# Patient Record
Sex: Female | Born: 1995 | Race: Black or African American | Hispanic: No | Marital: Single | Smoking: Light tobacco smoker
Health system: Southern US, Community
[De-identification: ages and names within clinical notes are randomized; demographics above are authoritative.]

## PROBLEM LIST (undated history)

## (undated) ENCOUNTER — Inpatient Hospital Stay (HOSPITAL_COMMUNITY): Payer: Self-pay

## (undated) DIAGNOSIS — O42912 Preterm premature rupture of membranes, unspecified as to length of time between rupture and onset of labor, second trimester: Secondary | ICD-10-CM

## (undated) DIAGNOSIS — A549 Gonococcal infection, unspecified: Secondary | ICD-10-CM

---
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
## 2016-02-28 ENCOUNTER — Emergency Department (HOSPITAL_COMMUNITY)
Admission: EM | Admit: 2016-02-28 | Discharge: 2016-02-28 | Disposition: A | Discharge status: Home / Self Care | Payer: Medicaid Other | Attending: Emergency Medicine | Admitting: Emergency Medicine

## 2016-02-28 ENCOUNTER — Emergency Department (HOSPITAL_COMMUNITY): Payer: Medicaid Other

## 2016-02-28 ENCOUNTER — Encounter (HOSPITAL_COMMUNITY): Payer: Self-pay | Admitting: Emergency Medicine

## 2016-02-28 DIAGNOSIS — F17210 Nicotine dependence, cigarettes, uncomplicated: Secondary | ICD-10-CM | POA: Diagnosis not present

## 2016-02-28 DIAGNOSIS — J45901 Unspecified asthma with (acute) exacerbation: Secondary | ICD-10-CM | POA: Diagnosis not present

## 2016-02-28 DIAGNOSIS — J45902 Unspecified asthma with status asthmaticus: Secondary | ICD-10-CM | POA: Insufficient documentation

## 2016-02-28 DIAGNOSIS — W19XXXA Unspecified fall, initial encounter: Secondary | ICD-10-CM

## 2016-02-28 DIAGNOSIS — S0181XA Laceration without foreign body of other part of head, initial encounter: Secondary | ICD-10-CM | POA: Diagnosis not present

## 2016-02-28 DIAGNOSIS — Y929 Unspecified place or not applicable: Secondary | ICD-10-CM | POA: Insufficient documentation

## 2016-02-28 DIAGNOSIS — Y939 Activity, unspecified: Secondary | ICD-10-CM | POA: Diagnosis not present

## 2016-02-28 DIAGNOSIS — Y999 Unspecified external cause status: Secondary | ICD-10-CM | POA: Insufficient documentation

## 2016-02-28 DIAGNOSIS — W01119A Fall on same level from slipping, tripping and stumbling with subsequent striking against unspecified sharp object, initial encounter: Secondary | ICD-10-CM | POA: Diagnosis not present

## 2016-02-28 DIAGNOSIS — S0990XA Unspecified injury of head, initial encounter: Secondary | ICD-10-CM | POA: Diagnosis present

## 2016-02-28 DIAGNOSIS — Z23 Encounter for immunization: Secondary | ICD-10-CM | POA: Diagnosis not present

## 2016-02-28 MED ORDER — HYDROCODONE-ACETAMINOPHEN 5-325 MG PO TABS: 1.0000 | ORAL_TABLET | Freq: Four times a day (QID) | ORAL | 0 refills | Status: DC | PRN

## 2016-02-28 MED ORDER — LIDOCAINE HCL 2 % IJ SOLN
20.0000 mL | Freq: Once | INTRAMUSCULAR | Status: AC
  Administered 2016-02-28: 400 mg
  Filled 2016-02-28: qty 20

## 2016-02-28 MED ORDER — TETANUS-DIPHTH-ACELL PERTUSSIS 5-2.5-18.5 LF-MCG/0.5 IM SUSP
0.5000 mL | Freq: Once | INTRAMUSCULAR | Status: AC
  Administered 2016-02-28: 0.5 mL via INTRAMUSCULAR
  Filled 2016-02-28: qty 0.5

## 2016-02-28 MED ORDER — BACITRACIN-NEOMYCIN-POLYMYXIN 400-5-5000 EX OINT
TOPICAL_OINTMENT | Freq: Once | CUTANEOUS | Status: AC
  Administered 2016-02-28: 1 via TOPICAL
  Filled 2016-02-28: qty 1

## 2016-02-28 NOTE — ED Notes (Signed)
Bed: SL37 Expected date:  Expected time:  Means of arrival:  Comments: Fall, head injury, loc

## 2016-02-28 NOTE — Discharge Instructions (Signed)
You were seen today after fall. You had a laceration repaired that needs stitches removed in 5 days.  Keep moist with bacitracin ointment.  Your CT scan was otherwise unremarkable. Follow-up with her primary physician for suture removal.

## 2016-02-28 NOTE — ED Triage Notes (Signed)
Pt reports falling and hitting face on dresser. Pt states that when she saw blood she passed out for a few seconds. Pt states that she had tripped and hit the dresser.

## 2016-02-28 NOTE — ED Notes (Signed)
Patient transported to CT 

## 2016-02-28 NOTE — ED Provider Notes (Signed)
LACERATION REPAIR Performed by: Arman Filter Authorized by: Arman Filter Consent: Verbal consent obtained. Risks and benefits: risks, benefits and alternatives were discussed Consent given by: patient Patient identity confirmed: provided demographic data Prepped and Draped in normal sterile fashion Wound explored  Laceration Location: nose  Laceration Length: 2cm  No Foreign Bodies seen or palpated  Anesthesia: local infiltration  Local anesthetic: lidocaine 1% without epinephrine  Anesthetic total: 2 ml  Irrigation method: syringe Amount of cleaning: standard  Skin closure: 3  6- prolene Subcutaneous 2 6-0 rapid vicryl  Number of sutures: 5  Technique: int.  Patient tolerance: Patient tolerated the procedure well with no immediate complications.   Earley Favor, NP 02/28/16 0165    Shon Baton, MD 02/28/16 [phone]

## 2016-02-28 NOTE — ED Triage Notes (Signed)
2cm x 0.5cm laceration to bridge of nose with bleeding controlled by 4x4

## 2016-02-28 NOTE — ED Notes (Signed)
Provider at bedside to perform laceration repair.
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
## 2016-02-28 NOTE — ED Notes (Signed)
Pt ambulated to restroom without any difficulties. Awaiting provider to perform laceration repair.

## 2016-02-28 NOTE — ED Provider Notes (Signed)
[facility]-EMERGENCY DEPT Provider Note   CSN: [id number] Arrival date & time: 02/28/16  0003  First Provider Contact:   First MD Initiated Contact with Patient 02/28/16 0030      By signing my name below, I, Laurie Sanders, attest that this documentation has been prepared under the direction and in the presence of Shon Baton, MD.  Electronically Signed: Octavia Sanders, ED Scribe. 02/28/16. 12:36 AM.    History   Chief Complaint No chief complaint on file.   The history is provided by the patient. No language interpreter was used.   HPI Comments: Laurie Sanders is a 20 y.o. female who presents to the Emergency Department after a fall that occurred this evening. She describes her facial pain as 8/10 and sore. Pt reports she tripped, fell and hit her face on her dresser. She has a small laceration to the bridge of her nose with bleeding currently controlled. Pt says she did not lose consciousness initially after the impact but did lose consciousness after seeing her blood. She denies vomiting. She is not up to date on her tetanus shot and does not have any known drug allergies.   Past Medical History:  Diagnosis Date  . Asthma   . Preterm premature rupture of membranes in second trimester 04/29/2011    Patient Active Problem List   Diagnosis Date Noted  . Respiratory failure (HCC) 06/11/2012  . Asthma exacerbation 06/11/2012  . Asthma with status asthmaticus 06/11/2012  . Status post cesarean delivery 06/16/2011    Past Surgical History:  Procedure Laterality Date  . CESAREAN SECTION  05/09/2011   Procedure: CESAREAN SECTION;  Surgeon: Catalina Antigua, MD;  Location: [facility];  Service: Gynecology;  Laterality: N/A;  . NO PAST SURGERIES      OB History    Gravida Para Term Preterm AB Living   1 1 0 1 0 1   SAB TAB Ectopic Multiple Live Births   0 0 0 0 1       Home Medications    Prior to Admission medications   Medication Sig Start Date End Date Taking? Authorizing  Provider  albuterol (PROVENTIL HFA;VENTOLIN HFA) 108 (90 BASE) MCG/ACT inhaler Inhale 2 puffs into the lungs every 4 (four) hours as needed for wheezing. 06/13/12  Yes Leigh-Anne Cioffredi, MD  EPINEPHrine (EPIPEN) 0.3 mg/0.3 mL IJ SOAJ injection Inject 0.3 mLs (0.3 mg total) into the muscle once. Patient not taking: Reported on 06/07/2015 03/02/14   Marcellina Millin, MD  HYDROcodone-acetaminophen (NORCO/VICODIN) 5-325 MG tablet Take 1 tablet by mouth every 6 (six) hours as needed. 02/28/16   Shon Baton, MD    Family History History reviewed. No pertinent family history.  Social History Social History  Substance Use Topics  . Smoking status: Heavy Tobacco Smoker    Packs/day: 0.50    Types: Cigarettes    Last attempt to quit: 05/24/2012  . Smokeless tobacco: Never Used  . Alcohol use No     Allergies   Benadryl [diphenhydramine hcl]   Review of Systems Review of Systems  Eyes: Negative for visual disturbance.  Gastrointestinal: Negative for vomiting.  Skin: Positive for wound.  Neurological: Negative for syncope.  All other systems reviewed and are negative.    Physical Exam Updated Vital Signs BP 113/70   Pulse 73   Temp 98.8 F (37.1 C) (Oral)   Resp 19   Ht  (1.626 m)   Wt 192 lb (87.1 kg)   LMP 02/26/2016   SpO2  99%   BMI 32.96 kg/m   Physical Exam  Constitutional: She is oriented to person, place, and time. No distress.  Tearful, ABC's intact, no acute distress  HENT:  Head: Normocephalic.  2 cm laceration noted over the nose just superior to the bridge of the nose, bleeding controlled, no obvious deformities, midface stable, contusion left temple  Eyes: Pupils are equal, round, and reactive to light.  Neck: Normal range of motion. Neck supple.  Cardiovascular: Normal rate, regular rhythm and normal heart sounds.   No murmur heard. Pulmonary/Chest: Effort normal and breath sounds normal. No respiratory distress. She has no wheezes.  Abdominal:  Soft. Bowel sounds are normal.  Musculoskeletal: Normal range of motion.  Neurological: She is alert and oriented to person, place, and time.  Skin: Skin is warm and dry.  See HEENT above  Psychiatric: She has a normal mood and affect.  Nursing note and vitals reviewed.    ED Treatments / Results  DIAGNOSTIC STUDIES: Oxygen Saturation is 100% on RA, normal by my interpretation.  COORDINATION OF CARE:  12:34 AM Discussed treatment plan which includes suture area and update tetanus shot with pt at bedside and pt agreed to plan.  Labs (all labs ordered are listed, but only abnormal results are displayed) Labs Reviewed - No data to display  EKG  EKG Interpretation  Date/Time:  Thursday February 28 2016 00:07:46 EDT Ventricular Rate:  74 PR Interval:    QRS Duration: 89 QT Interval:  390 QTC Calculation: 433 R Axis:   71 Text Interpretation:  Sinus rhythm Confirmed by Wilkie Aye  MD, Toni Amend ([id number]) on 02/28/2016 12:25:38 AM       Radiology Ct Head Wo Contrast  Result Date: 02/28/2016 CLINICAL DATA:  20 year old female with trauma EXAM: CT HEAD WITHOUT CONTRAST CT MAXILLOFACIAL WITHOUT CONTRAST TECHNIQUE: Multidetector CT imaging of the head and maxillofacial structures were performed using the standard protocol without intravenous contrast. Multiplanar CT image reconstructions of the maxillofacial structures were also generated. COMPARISON:  Head CT dated 09/16/13 FINDINGS: CT HEAD FINDINGS The ventricles and the sulci are appropriate in size for the patient's age. There is no intracranial hemorrhage. No midline shift or mass effect identified. The gray-white matter differentiation is preserved. There is mild mucoperiosteal thickening of paranasal sinuses with partial opacification of the ethmoid air cells. No air-fluid levels. The mastoid air cells are clear. The calvarium is intact CT MAXILLOFACIAL FINDINGS There is no acute fracture of the facial bone. Maxilla mandible and pterygoid  plates are intact. The globes, retro-orbital fat, and orbital walls are preserved. There is dental caries of the left mandibular first molar tooth with lucency of the alveolar bone adjacent to the root. Correlation with dental exam recommended. There is skin laceration over the bridge of the nose. No hematoma. IMPRESSION: No acute intracranial pathology. No acute/ traumatic facial bone fractures. Dental caries of the left mandibular first molar tooth. Electronically Signed   By: Elgie Collard M.D.   On: 02/28/2016 01:25   Ct Maxillofacial Wo Contrast  Result Date: 02/28/2016 CLINICAL DATA:  20 year old female with trauma EXAM: CT HEAD WITHOUT CONTRAST CT MAXILLOFACIAL WITHOUT CONTRAST TECHNIQUE: Multidetector CT imaging of the head and maxillofacial structures were performed using the standard protocol without intravenous contrast. Multiplanar CT image reconstructions of the maxillofacial structures were also generated. COMPARISON:  Head CT dated 09/16/13 FINDINGS: CT HEAD FINDINGS The ventricles and the sulci are appropriate in size for the patient's age. There is no intracranial hemorrhage. No midline shift or  mass effect identified. The gray-white matter differentiation is preserved. There is mild mucoperiosteal thickening of paranasal sinuses with partial opacification of the ethmoid air cells. No air-fluid levels. The mastoid air cells are clear. The calvarium is intact CT MAXILLOFACIAL FINDINGS There is no acute fracture of the facial bone. Maxilla mandible and pterygoid plates are intact. The globes, retro-orbital fat, and orbital walls are preserved. There is dental caries of the left mandibular first molar tooth with lucency of the alveolar bone adjacent to the root. Correlation with dental exam recommended. There is skin laceration over the bridge of the nose. No hematoma. IMPRESSION: No acute intracranial pathology. No acute/ traumatic facial bone fractures. Dental caries of the left mandibular  first molar tooth. Electronically Signed   By: Elgie Collard M.D.   On: 02/28/2016 01:25    Procedures Procedures (including critical care time)  Medications Ordered in ED Medications  Tdap (BOOSTRIX) injection 0.5 mL (0.5 mLs Intramuscular Given 02/28/16 0050)  lidocaine (XYLOCAINE) 2 % (with pres) injection 400 mg (400 mg Other Given 02/28/16 0052)  neomycin-bacitracin-polymyxin (NEOSPORIN) ointment (1 application Topical Given 02/28/16 0245)     Initial Impression / Assessment and Plan / ED Course  I have reviewed the triage vital signs and the nursing notes.  Pertinent labs & imaging results that were available during my care of the patient were reviewed by me and considered in my medical decision making (see chart for details).  Clinical Course   Patient presents following a fall. Reports mechanical fall. She hit her head and has a laceration over the bridge of her nose. She reports loss of consciousness after fall in seeing her in blood. Given the laceration and loss of consciousness, CT scans obtained and reassuring. Laceration repaired by PA. No other injury noted.  Final Clinical Impressions(s) / ED Diagnoses   Final diagnoses:  Fall, initial encounter  Facial laceration, initial encounter   I personally performed the services described in this documentation, which was scribed in my presence. The recorded information has been reviewed and is accurate.  New Prescriptions New Prescriptions   HYDROCODONE-ACETAMINOPHEN (NORCO/VICODIN) 5-325 MG TABLET    Take 1 tablet by mouth every 6 (six) hours as needed.     Shon Baton, MD 02/28/16 [phone]

## 2016-03-04 ENCOUNTER — Emergency Department (HOSPITAL_COMMUNITY)
Admission: EM | Admit: 2016-03-04 | Discharge: 2016-03-04 | Disposition: A | Discharge status: Home / Self Care | Payer: Medicaid Other | Attending: Emergency Medicine | Admitting: Emergency Medicine

## 2016-03-04 ENCOUNTER — Encounter (HOSPITAL_COMMUNITY): Payer: Self-pay | Admitting: Vascular Surgery

## 2016-03-04 DIAGNOSIS — Z4802 Encounter for removal of sutures: Secondary | ICD-10-CM | POA: Diagnosis not present

## 2016-03-04 DIAGNOSIS — F17210 Nicotine dependence, cigarettes, uncomplicated: Secondary | ICD-10-CM | POA: Insufficient documentation

## 2016-03-04 DIAGNOSIS — J45901 Unspecified asthma with (acute) exacerbation: Secondary | ICD-10-CM | POA: Diagnosis not present

## 2016-03-04 NOTE — ED Triage Notes (Signed)
Pt here for suture removal. Denies any pain, drainage, or site complications.

## 2016-03-04 NOTE — ED Notes (Signed)
Skin assessment:3 sutures in place on lac at bridge of nose, removed, lac appears healed

## 2016-03-04 NOTE — ED Provider Notes (Signed)
[facility]-EMERGENCY DEPT Provider Note   CSN: [id number] Arrival date & time: 03/04/16  1757  First Provider Contact:  None    By signing my name below, I, Majel HomerPeyton Lee, attest that this documentation has been prepared under the direction and in the presence of non-physician practitioner, Kerrie BuffaloHope Georgeann Brinkman, NP. Electronically Signed: Majel HomerPeyton Lee, Scribe. 03/04/2016. 6:54 PM.  History   Chief Complaint Chief Complaint  Patient presents with  . Suture / Staple Removal   The history is provided by the patient. No language interpreter was used.   HPI Comments: Laurie Sanders is a 20 y.o. female who presents to the Emergency Department for suture removal of five sutures on her nasal bridge. Pt reports she had 3 sutures placed on the outside and 2 on the inside. She denies any pain, drainage or site complications.   Past Medical History:  Diagnosis Date  . Asthma   . Preterm premature rupture of membranes in second trimester 04/29/2011   Patient Active Problem List   Diagnosis Date Noted  . Respiratory failure (HCC) 06/11/2012  . Asthma exacerbation 06/11/2012  . Asthma with status asthmaticus 06/11/2012  . Status post cesarean delivery 06/16/2011    Past Surgical History:  Procedure Laterality Date  . CESAREAN SECTION  05/09/2011   Procedure: CESAREAN SECTION;  Surgeon: Catalina AntiguaPeggy Constant, MD;  Location: [facility];  Service: Gynecology;  Laterality: N/A;  . NO PAST SURGERIES      OB History    Gravida Para Term Preterm AB Living   1 1 0 1 0 1   SAB TAB Ectopic Multiple Live Births   0 0 0 0 1     Home Medications    Prior to Admission medications   Medication Sig Start Date End Date Taking? Authorizing Provider  albuterol (PROVENTIL HFA;VENTOLIN HFA) 108 (90 BASE) MCG/ACT inhaler Inhale 2 puffs into the lungs every 4 (four) hours as needed for wheezing. 06/13/12   Leigh-Anne Cioffredi, MD  EPINEPHrine (EPIPEN) 0.3 mg/0.3 mL IJ SOAJ injection Inject 0.3 mLs (0.3 mg total) into the muscle  once. Patient not taking: Reported on 06/07/2015 03/02/14   Marcellina Millinimothy Galey, MD  HYDROcodone-acetaminophen (NORCO/VICODIN) 5-325 MG tablet Take 1 tablet by mouth every 6 (six) hours as needed. 02/28/16   Shon Batonourtney F Horton, MD    Family History No family history on file.  Social History Social History  Substance Use Topics  . Smoking status: Heavy Tobacco Smoker    Packs/day: 0.50    Types: Cigarettes    Last attempt to quit: 05/24/2012  . Smokeless tobacco: Never Used  . Alcohol use No     Allergies   Benadryl [diphenhydramine hcl]   Review of Systems Review of Systems  Constitutional: Negative for fever.  Skin: Positive for wound.   Physical Exam Updated Vital Signs BP 98/66 (BP Location: Left Arm)   Pulse 74   Temp 98.2 F (36.8 C) (Oral)   Resp 16   LMP 02/26/2016   SpO2 100%   Physical Exam  Constitutional: She is oriented to person, place, and time. She appears well-developed and well-nourished.  HENT:  laceration to nasal bridge, healing without drainage or signs of infection   Eyes: Conjunctivae and EOM are normal. Right eye exhibits no discharge. Left eye exhibits no discharge. No scleral icterus.  Neck: Normal range of motion.  Cardiovascular: Normal rate.   Pulmonary/Chest: Effort normal. No stridor.  Musculoskeletal: Normal range of motion.  Neurological: She is alert and oriented to person, place, and time.  Coordination normal.  Skin: Skin is warm and dry.  Psychiatric: She has a normal mood and affect. Her behavior is normal.  Nursing note and vitals reviewed.  ED Treatments / Results  Labs (all labs ordered are listed, but only abnormal results are displayed) Labs Reviewed - No data to display Radiology No results found.  Procedures Procedures  DIAGNOSTIC STUDIES:  Oxygen Saturation is 100% on RA, normal by my interpretation.    COORDINATION OF CARE:  6:54 PM Discussed treatment plan, which includes suture removal with pt at bedside and pt  agreed to plan.  Medications Ordered in ED Medications - No data to display   Initial Impression / Assessment and Plan / ED Course  I have reviewed the triage vital signs and the nursing notes.  Clinical Course  Suture removal by RN  I personally performed the services described in this documentation, which was scribed in my presence. The recorded information has been reviewed and is accurate.   Final Clinical Impressions(s) / ED Diagnoses   Final diagnoses:  Encounter for removal of sutures   Staple removal   Pt to ER for staple/suture removal and wound check as above. Procedure tolerated well. Vitals normal, no signs of infection. Scar minimization & return precautions given at dc.   New Prescriptions Discharge Medication List as of 03/04/2016  6:54 PM       Janne Napoleon, NP 03/04/16 1939    Loren Racer, MD 03/04/16 2259

## 2017-02-23 ENCOUNTER — Emergency Department (HOSPITAL_COMMUNITY)
Admission: EM | Admit: 2017-02-23 | Discharge: 2017-02-24 | Disposition: A | Discharge status: Other Acute Inpatient Hospital | Payer: Medicaid Other | Attending: Emergency Medicine | Admitting: Emergency Medicine

## 2017-02-23 ENCOUNTER — Encounter (HOSPITAL_COMMUNITY): Payer: Self-pay | Admitting: Emergency Medicine

## 2017-02-23 DIAGNOSIS — F17210 Nicotine dependence, cigarettes, uncomplicated: Secondary | ICD-10-CM | POA: Insufficient documentation

## 2017-02-23 DIAGNOSIS — J45909 Unspecified asthma, uncomplicated: Secondary | ICD-10-CM | POA: Insufficient documentation

## 2017-02-23 DIAGNOSIS — F329 Major depressive disorder, single episode, unspecified: Secondary | ICD-10-CM | POA: Diagnosis present

## 2017-02-23 DIAGNOSIS — F332 Major depressive disorder, recurrent severe without psychotic features: Secondary | ICD-10-CM | POA: Insufficient documentation

## 2017-02-23 DIAGNOSIS — T50902A Poisoning by unspecified drugs, medicaments and biological substances, intentional self-harm, initial encounter: Secondary | ICD-10-CM | POA: Insufficient documentation

## 2017-02-23 LAB — COMPREHENSIVE METABOLIC PANEL
ALT: 16 U/L (ref 14–54)
ANION GAP: 9 (ref 5–15)
AST: 23 U/L (ref 15–41)
Albumin: 4 g/dL (ref 3.5–5.0)
Alkaline Phosphatase: 65 U/L (ref 38–126)
BUN: 6 mg/dL (ref 6–20)
CHLORIDE: 108 mmol/L (ref 101–111)
CO2: 21 mmol/L — AB (ref 22–32)
Calcium: 9.1 mg/dL (ref 8.9–10.3)
Creatinine, Ser: 0.89 mg/dL (ref 0.44–1.00)
Glucose, Bld: 85 mg/dL (ref 65–99)
POTASSIUM: 3.9 mmol/L (ref 3.5–5.1)
SODIUM: 138 mmol/L (ref 135–145)
Total Bilirubin: 0.6 mg/dL (ref 0.3–1.2)
Total Protein: 6.9 g/dL (ref 6.5–8.1)

## 2017-02-23 LAB — CBC
HCT: 39.2 % (ref 36.0–46.0)
HEMOGLOBIN: 13.3 g/dL (ref 12.0–15.0)
MCH: 31.6 pg (ref 26.0–34.0)
MCHC: 33.9 g/dL (ref 30.0–36.0)
MCV: 93.1 fL (ref 78.0–100.0)
PLATELETS: 340 10*3/uL (ref 150–400)
RBC: 4.21 MIL/uL (ref 3.87–5.11)
RDW: 12.6 % (ref 11.5–15.5)
WBC: 15.3 10*3/uL — AB (ref 4.0–10.5)

## 2017-02-23 LAB — ACETAMINOPHEN LEVEL

## 2017-02-23 LAB — ETHANOL

## 2017-02-23 LAB — I-STAT BETA HCG BLOOD, ED (MC, WL, AP ONLY)

## 2017-02-23 LAB — SALICYLATE LEVEL
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
## 2017-02-23 NOTE — Progress Notes (Signed)
TTS attempted to contact EDP Dr. Madilyn Hookees, MD and Hanks, Nicholaus CorollaKristen R, RN to notify them of the recommendation but was placed on hold by nurse secretary Debbie and after several minutes was still unable to reach anyone. TTS attempted to locate EDP and RN but was unsuccessful.  Princess BruinsAquicha Lekendrick Alpern, MSW, LCSWA TTS Specialist [phone]

## 2017-02-23 NOTE — ED Notes (Signed)
Poison control recommends supportive care.  Pt can be cleared after tylenol level repeated after 4 hours. Recommends drawing CMP, and acetaminophen level.

## 2017-02-23 NOTE — Progress Notes (Signed)
Pt requested her father be contacted with updates: Exell Pettway [phone]

## 2017-02-23 NOTE — ED Notes (Signed)
Bed: WA02 Expected date:  Expected time:  Means of arrival:  Comments: overdose

## 2017-02-24 ENCOUNTER — Encounter (HOSPITAL_COMMUNITY): Payer: Self-pay

## 2017-02-24 ENCOUNTER — Inpatient Hospital Stay (HOSPITAL_COMMUNITY)
Admission: AD | Admit: 2017-02-24 | Discharge: 2017-02-27 | DRG: 885 | Disposition: A | Discharge status: Home / Self Care | Payer: Federal, State, Local not specified - Other | Source: Intra-hospital | Attending: Psychiatry | Admitting: Psychiatry

## 2017-02-24 DIAGNOSIS — F17210 Nicotine dependence, cigarettes, uncomplicated: Secondary | ICD-10-CM | POA: Diagnosis present

## 2017-02-24 DIAGNOSIS — F410 Panic disorder [episodic paroxysmal anxiety] without agoraphobia: Secondary | ICD-10-CM | POA: Diagnosis not present

## 2017-02-24 DIAGNOSIS — T50902A Poisoning by unspecified drugs, medicaments and biological substances, intentional self-harm, initial encounter: Secondary | ICD-10-CM

## 2017-02-24 DIAGNOSIS — Z888 Allergy status to other drugs, medicaments and biological substances status: Secondary | ICD-10-CM

## 2017-02-24 DIAGNOSIS — Z91018 Allergy to other foods: Secondary | ICD-10-CM | POA: Diagnosis not present

## 2017-02-24 DIAGNOSIS — T1491XA Suicide attempt, initial encounter: Secondary | ICD-10-CM | POA: Diagnosis not present

## 2017-02-24 DIAGNOSIS — F329 Major depressive disorder, single episode, unspecified: Secondary | ICD-10-CM | POA: Diagnosis present

## 2017-02-24 DIAGNOSIS — J45909 Unspecified asthma, uncomplicated: Secondary | ICD-10-CM | POA: Diagnosis present

## 2017-02-24 DIAGNOSIS — F332 Major depressive disorder, recurrent severe without psychotic features: Secondary | ICD-10-CM | POA: Diagnosis present

## 2017-02-24 DIAGNOSIS — Z915 Personal history of self-harm: Secondary | ICD-10-CM | POA: Diagnosis not present

## 2017-02-24 DIAGNOSIS — F4321 Adjustment disorder with depressed mood: Secondary | ICD-10-CM | POA: Diagnosis not present

## 2017-02-24 DIAGNOSIS — T39392A Poisoning by other nonsteroidal anti-inflammatory drugs [NSAID], intentional self-harm, initial encounter: Secondary | ICD-10-CM | POA: Diagnosis not present

## 2017-02-24 HISTORY — DX: Major depressive disorder, recurrent severe without psychotic features: F33.2

## 2017-02-24 LAB — COMPREHENSIVE METABOLIC PANEL
ALK PHOS: 60 U/L (ref 38–126)
ALT: 14 U/L (ref 14–54)
ANION GAP: 8 (ref 5–15)
AST: 18 U/L (ref 15–41)
Albumin: 3.7 g/dL (ref 3.5–5.0)
BUN: 7 mg/dL (ref 6–20)
CALCIUM: 9 mg/dL (ref 8.9–10.3)
CO2: 23 mmol/L (ref 22–32)
CREATININE: 0.89 mg/dL (ref 0.44–1.00)
Chloride: 107 mmol/L (ref 101–111)
Glucose, Bld: 92 mg/dL (ref 65–99)
Potassium: 3.6 mmol/L (ref 3.5–5.1)
Sodium: 138 mmol/L (ref 135–145)
TOTAL PROTEIN: 6.3 g/dL — AB (ref 6.5–8.1)
Total Bilirubin: 0.6 mg/dL (ref 0.3–1.2)

## 2017-02-24 LAB — RAPID URINE DRUG SCREEN, HOSP PERFORMED
AMPHETAMINES: NOT DETECTED
BENZODIAZEPINES: NOT DETECTED
Barbiturates: NOT DETECTED
Cocaine: NOT DETECTED
OPIATES: NOT DETECTED
TETRAHYDROCANNABINOL: NOT DETECTED

## 2017-02-24 LAB — ACETAMINOPHEN LEVEL: Acetaminophen (Tylenol), Serum: <10 ug/mL — ABNORMAL LOW (ref 10–30)

## 2017-02-24 MED ORDER — ALUM & MAG HYDROXIDE-SIMETH 200-200-20 MG/5ML PO SUSP: 30.0000 mL | ORAL | Status: DC | PRN

## 2017-02-24 MED ORDER — FLUOXETINE HCL 10 MG PO CAPS
10.0000 mg | ORAL_CAPSULE | Freq: Every day | ORAL | Status: DC
  Administered 2017-02-25 – 2017-02-26 (×2): 10 mg via ORAL
  Filled 2017-02-24 (×4): qty 1

## 2017-02-24 MED ORDER — FLUOXETINE HCL 10 MG PO CAPS
10.0000 mg | ORAL_CAPSULE | Freq: Every day | ORAL | Status: DC
  Administered 2017-02-24: 10 mg via ORAL
  Filled 2017-02-24: qty 1

## 2017-02-24 MED ORDER — ACETAMINOPHEN 325 MG PO TABS: 650.0000 mg | ORAL_TABLET | Freq: Four times a day (QID) | ORAL | Status: DC | PRN

## 2017-02-24 MED ORDER — MAGNESIUM HYDROXIDE 400 MG/5ML PO SUSP: 30.0000 mL | Freq: Every day | ORAL | Status: DC | PRN

## 2017-02-24 MED ORDER — TRAZODONE HCL 50 MG PO TABS
50.0000 mg | ORAL_TABLET | Freq: Every evening | ORAL | Status: DC | PRN
  Administered 2017-02-24 – 2017-02-25 (×3): 50 mg via ORAL
  Filled 2017-02-24 (×8): qty 1

## 2017-02-24 NOTE — ED Notes (Signed)
Patient wanded by security. 
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
## 2017-02-24 NOTE — Progress Notes (Signed)
Adult Psychoeducational Group Note  Date:  02/24/2017 Time:  9:10 PM  Group Topic/Focus:  Wrap-Up Group:   The focus of this group is to help patients review their daily goal of treatment and discuss progress on daily workbooks.  Participation Level:  Active  Participation Quality:  Appropriate  Affect:  Appropriate  Cognitive:  Alert  Insight: Appropriate  Engagement in Group:  Engaged  Modes of Intervention:  Discussion  Additional Comments:  Patient rated her day a 5.  Kamali Nephew L Malinda Mayden 02/24/2017, 9:10 PM

## 2017-02-24 NOTE — ED Notes (Signed)
Pt discharged safely with [place] driver.  Pt was in no distress at transfer.  All belongings were sent with patient.

## 2017-02-24 NOTE — ED Notes (Signed)
Bed: WA27 Expected date:  Expected time:  Means of arrival:  Comments: [place]

## 2017-02-24 NOTE — BH Assessment (Signed)
[facility] Assessment Progress Note  Per Thedore MinsMojeed Akintayo, MD, this pt requires psychiatric hospitalization at this time.  Laurie Heinrichina Tate, RN, [facility]; they will be ready to receive pt at 15:30.  Pt has signed Voluntary Admission and Consent for Treatment, as well as Consent to Release Information to no one, and signed forms [facility].  Pt's nurse, Laurie Sanders, has been notified, and agrees to send original paperwork along with pt via [facility], and to call report to [phone].  Laurie Canninghomas Kailena Lubas, MA Triage Specialist [phone]

## 2017-02-24 NOTE — Progress Notes (Signed)
Report given once labs came back negative. Pt moved to [facility]. Laurie Sanders Laurie Sanders

## 2017-02-24 NOTE — Progress Notes (Signed)
02/24/2017 Approximately 1:41pm intern asked pt if she was interested in doing any activities and pt was interested. Pt joined intern, LRT and peer in the dayroom. Pt played UNO with intern, LRT and peer and was pleasant for the duration of the activity.  Marvell Fullerachel Meyer, Recreation Therapy Intern  Caroll RancherMarjette Lidwina Kaner, LRT/CTRS

## 2017-02-24 NOTE — Tx Team (Signed)
Initial Treatment Plan 02/24/2017 4:38 PM Laurie Sanders [id number]    PATIENT STRESSORS: Financial difficulties Marital or family conflict Occupational concerns   PATIENT STRENGTHS: Wellsite geologistCommunication skills General fund of knowledge Physical Health   PATIENT IDENTIFIED PROBLEMS: Depression  Suicidal ideation  "I want to learn how to walk away"  "Not have suicidal thoughts/attempt"               DISCHARGE CRITERIA:  Improved stabilization in mood, thinking, and/or behavior Verbal commitment to aftercare and medication compliance  PRELIMINARY DISCHARGE PLAN: Outpatient therapy Medication management  PATIENT/FAMILY INVOLVEMENT: This treatment plan has been presented to and reviewed with the patient, Laurie Sanders.  The patient and family have been given the opportunity to ask questions and make suggestions.  Levin BaconHeather V Kesler Wickham, RN 02/24/2017, 4:38 PM

## 2017-02-24 NOTE — Progress Notes (Addendum)
Patient ID: Laurie Sanders, female   DOB: 16-Jan-1996, 21 y.o.   MRN: [id number]  Pt currently presents with a nervous affect and guarded behavior. Pt forwards little to Clinical research associatewriter, expresses interest in speaking with MD in the am about medications. Pt denies any current concerns, reports good sleep at home without sleep aid. Pt seen in dayroom sitting beside peers but not interacting or facing in their direction.   Pt provided with medications per providers orders. Pt's labs and vitals were monitored throughout the night. Pt given a 1:1 about emotional and mental status. Pt supported and encouraged to express concerns and questions.  Pt's safety ensured with 15 minute and environmental checks. Pt currently denies SI/HI and A/V hallucinations. Pt verbally agrees to seek staff if SI/HI or A/VH occurs and to consult with staff before acting on any harmful thoughts. Will continue POC.
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
## 2017-02-25 ENCOUNTER — Ambulatory Visit: Payer: Medicaid Other

## 2017-02-25 DIAGNOSIS — F17210 Nicotine dependence, cigarettes, uncomplicated: Secondary | ICD-10-CM

## 2017-02-25 DIAGNOSIS — F4321 Adjustment disorder with depressed mood: Secondary | ICD-10-CM

## 2017-02-25 DIAGNOSIS — F410 Panic disorder [episodic paroxysmal anxiety] without agoraphobia: Secondary | ICD-10-CM

## 2017-02-25 DIAGNOSIS — T39392A Poisoning by other nonsteroidal anti-inflammatory drugs [NSAID], intentional self-harm, initial encounter: Secondary | ICD-10-CM

## 2017-02-25 DIAGNOSIS — T1491XA Suicide attempt, initial encounter: Secondary | ICD-10-CM

## 2017-02-25 NOTE — Plan of Care (Signed)
Problem: Medication: Goal: Compliance with prescribed medication regimen will improve Outcome: Progressing Pt compliant with taking meds.    

## 2017-02-25 NOTE — Progress Notes (Signed)
Recreation Therapy Notes  Date:02/25/2017 Time:9:30am Location: 300 Hall Dayroom  Group Topic: Stress Management  Goal Area(s) Addresses:  Patient will verbalize importance of using healthy stress management.  Patient will identify positive emotions associated with healthy stress management.   Intervention: Stress Management  Activity: Visual Relaxation. Recreation Therapy Intern introduced the stress management technique of visual relaxation. Recreation Therapy Intern read a script that allowed patients to take mental trip to the beach. Recreation Therapy Intern played calming beach wave sounds. Patients were to follow along as script was read to engage in the activity.  Education:Stress Management, Discharge Planning.   Education Outcome:Needs additional education  Clinical Observations/Feedback:Pt did not attend group.  Rachel Meyer, Recreation Therapy Intern  Kenady Doxtater, LRT/CTRS 

## 2017-02-25 NOTE — Progress Notes (Signed)
Nursing Progress Note: 7p-7a D: Pt currently presents with a pleasant/animanted/cooperative affect and behavior. Pt states "I had a fantastic day today. I really feel like I am fitting in just great." Interacting appropriately with milieu. Pt reports off and on sleep during the previous night with last night's medication regimen.   A: Pt provided with medications per providers orders. Pt's labs and vitals were monitored throughout the night. Pt supported emotionally and encouraged to express concerns and questions. Pt educated on medications.  R: Pt's safety ensured with 15 minute and environmental checks. Pt currently denies SI, HI, and AVH. Pt verbally contracts to seek staff if SI,HI, or AVH occurs and to consult with staff before acting on any harmful thoughts. Will continue to monitor.

## 2017-02-25 NOTE — [facility] Suicide Risk Assessment (Signed)
[facility] INPATIENT:  Family/Significant Other Suicide Prevention Education  Suicide Prevention Education:  Patient Refusal for Family/Significant Other Suicide Prevention Education: The patient Laurie Sanders has refused to provide written consent for family/significant other to be provided Family/Significant Other Suicide Prevention Education during admission and/or prior to discharge.  Physician notified.  Jonathon JordanLynn B Philomina Leon, MSW, LCSWA 02/25/2017, 4:00 PM

## 2017-02-25 NOTE — [facility] Group Notes (Signed)
[facility] Mental Health Association Group Therapy 02/25/2017 1:15pm  Type of Therapy: Mental Health Association Presentation  Participation Level: Active  Participation Quality: Attentive  Affect: Appropriate  Cognitive: Oriented  Insight: Developing/Improving  Engagement in Therapy: Engaged  Modes of Intervention: Discussion, Education and Socialization  Summary of Progress/Problems: Mental Health Association (MHA) Speaker came to talk about his personal journey with living with a mental health diagnosis.The pt processed ways by which to relate to the speaker. MHA speaker provided handouts and educational information pertaining to groups and services offered by the MHA. Pt was engaged in speaker's presentation and was receptive to resources provided.    Macallan Ord B. Carola Viramontes, MSW, LCSWA 02/25/2017 3:12 PM   

## 2017-02-25 NOTE — Progress Notes (Signed)
D:Pt presents with a flat affect and depressed mood. Pt appears guarded and forwards little information. Pt appears to be minimizing her symptoms. Pt denies feeling depressed or anxious today. Pt denies SI. Pt reports feeling "tired and sleepy this morning". Pt has minimal interaction on the unit and little engagement with peers.  A: Medications reviewed with pt. Medications administered as ordered per MD. Verbal support provided. Pt encouraged to attend groups. 15 minute checks performed for safety. R: Pt compliant with tx.
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
## 2017-02-25 NOTE — H&P (Signed)
Psychiatric Admission Assessment Adult  Patient Identification: Laurie Sanders MRN:  [id number] Date of Evaluation:  02/25/2017 Chief Complaint:  " I took some pills" Principal Diagnosis:  Adjustment Disorder, Depressed, S/P Suicide Attempt by Overdosing  Diagnosis:   Patient Active Problem List   Diagnosis Date Noted  . 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 Major depressive disorder, recurrent severe without psychotic features ([place]) [F33.2] 02/24/2017  . Respiratory failure ([place]) [J96.90] 06/11/2012  . Asthma exacerbation [J45.901] 06/11/2012  . Asthma with status asthmaticus [J45.902] 06/11/2012  . Status post cesarean delivery [Z98.891] 06/16/2011   History of Present Illness: 21 year old single female. States she impulsively overdosed on NSAID ( states she took about 8 200 mgr Ibuprofen tablets) . States that " as soon as I took them I kind of regretted it".  States that after the overdose she had a " really bad panic attack" and a concerned neighbor called 911. This event occurred 2 days ago.  States that on day of overdosing she was feeling depressed, upset about being " cyber bullied by my ex's family".States they are sending her " nasty messages on Facebook", and " I guess at that moment I just got fed up".   However, she states that she has not been feeling particularly depressed before then, and minimizes neuro-vegetative symptoms of depression. Associated Signs/Symptoms: Depression Symptoms:  suicidal attempt, denies anhedonia, denies changes in sleep, energy, or appetite, denies pervasive sense of sadness, describes good sense of self esteem (Hypo) Manic Symptoms:  None endorsed Anxiety Symptoms:  Denies anxiety other than panic attack following overdose  Psychotic Symptoms:  Denies  PTSD Symptoms: Denies  Total Time spent with patient: 45 minutes   Past Psychiatric History: denies any prior psychiatric admissions, denies any prior  history of suicide attempts, history of self cutting at age 77-14, not in a long time, denies any history of psychosis, denies history of PTSD, denies history of depressive episodes, denies history of post partum depression. Denies history of mania, denies history of panic or agoraphobia. Denies history of violence .  Is the patient at risk to self? Yes.    Has the patient been a risk to self in the past 6 months? No.  Has the patient been a risk to self within the distant past? No.  Is the patient a risk to others? No.  Has the patient been a risk to others in the past 6 months? No.  Has the patient been a risk to others within the distant past? No.   Prior Inpatient Therapy:   denies  Prior Outpatient Therapy:  denies   Alcohol Screening: 1. How often do you have a drink containing alcohol?: Never 9. Have you or someone else been injured as a result of your drinking?: No 10. Has a relative or friend or a doctor or another health worker been concerned about your drinking or suggested you cut down?: No Alcohol Use Disorder Identification Test Final Score (AUDIT): 0 Brief Intervention: AUDIT score less than 7 or less-screening does not suggest unhealthy drinking-brief intervention not indicated Substance Abuse History in the last 12 months: denies any drug or alcohol abuse Consequences of Substance Abuse: Denies  Previous Psychotropic Medications: states she has never been on any psychiatric medications Psychological Evaluations:  No  Past Medical History:  Past Medical History:  Diagnosis Date  . Asthma   . Preterm premature rupture of membranes in second trimester 04/29/2011    Past Surgical History:  Procedure  Laterality Date  . CESAREAN SECTION  05/09/2011   Procedure: CESAREAN SECTION;  Surgeon: Mora Bellman, MD;  Location: [facility];  Service: Gynecology;  Laterality: N/A;  . NO PAST SURGERIES     Family History:  Parents alive, divorced, has 2 brothers  Family Psychiatric   History:  Denies any family history of mental illness, denies any suicides in family  Tobacco Screening: Have you used any form of tobacco in the last 30 days? (Cigarettes, Smokeless Tobacco, Cigars, and/or Pipes): Yes Tobacco use, Select all that apply: 5 or more cigarettes per day Are you interested in Tobacco Cessation Medications?: No, patient refused Counseled patient on smoking cessation including recognizing danger situations, developing coping skills and basic information about quitting provided: Refused/Declined practical counseling Social History: 21 year old single female, has a 105 year old daughter ,lives with daughter,  child currently with mother, unemployed, upcoming court date for larceny. Programmer, systems. History  Alcohol Use No     History  Drug Use No    Additional Social History:  Allergies:   Allergies  Allergen Reactions  . Benadryl [Diphenhydramine Hcl] Hives  . Kiwi Extract Swelling    Lips swell   Lab Results:  Results for orders placed or performed during the hospital encounter of 02/23/17 (from the past 48 hour(s))  Comprehensive metabolic panel     Status: Abnormal   Collection Time: 02/23/17 10:03 PM  Result Value Ref Range   Sodium 138 135 - 145 mmol/L   Potassium 3.9 3.5 - 5.1 mmol/L   Chloride 108 101 - 111 mmol/L   CO2 21 (L) 22 - 32 mmol/L   Glucose, Bld 85 65 - 99 mg/dL   BUN 6 6 - 20 mg/dL   Creatinine, Ser 0.89 0.44 - 1.00 mg/dL   Calcium 9.1 8.9 - 10.3 mg/dL   Total Protein 6.9 6.5 - 8.1 g/dL   Albumin 4.0 3.5 - 5.0 g/dL   AST 23 15 - 41 U/L   ALT 16 14 - 54 U/L   Alkaline Phosphatase 65 38 - 126 U/L   Total Bilirubin 0.6 0.3 - 1.2 mg/dL   GFR calc non Af Amer >60 >60 mL/min   GFR calc Af Amer >60 >60 mL/min    Comment: (NOTE) The eGFR has been calculated using the CKD EPI equation. This calculation has not been validated in all clinical situations. eGFR's persistently <60 mL/min signify possible Chronic Kidney Disease.     Anion gap 9 5 - 15  Ethanol     Status: None   Collection Time: 02/23/17 10:03 PM  Result Value Ref Range   Alcohol, Ethyl (B) <5 <5 mg/dL    Comment:        LOWEST DETECTABLE LIMIT FOR SERUM ALCOHOL IS 5 mg/dL FOR MEDICAL PURPOSES ONLY   Salicylate level     Status: None   Collection Time: 02/23/17 10:03 PM  Result Value Ref Range   Salicylate Lvl <3.7 2.8 - 30.0 mg/dL  Acetaminophen level     Status: Abnormal   Collection Time: 02/23/17 10:03 PM  Result Value Ref Range   Acetaminophen (Tylenol), Serum <10 (L) 10 - 30 ug/mL    Comment:        THERAPEUTIC CONCENTRATIONS VARY SIGNIFICANTLY. A RANGE OF 10-30 ug/mL MAY BE AN EFFECTIVE CONCENTRATION FOR MANY PATIENTS. HOWEVER, SOME ARE BEST TREATED AT CONCENTRATIONS OUTSIDE THIS RANGE. ACETAMINOPHEN CONCENTRATIONS >150 ug/mL AT 4 HOURS AFTER INGESTION AND >50 ug/mL AT 12 HOURS AFTER INGESTION ARE  OFTEN ASSOCIATED WITH TOXIC REACTIONS.   cbc     Status: Abnormal   Collection Time: 02/23/17 10:03 PM  Result Value Ref Range   WBC 15.3 (H) 4.0 - 10.5 K/uL   RBC 4.21 3.87 - 5.11 MIL/uL   Hemoglobin 13.3 12.0 - 15.0 g/dL   HCT 39.2 36.0 - 46.0 %   MCV 93.1 78.0 - 100.0 fL   MCH 31.6 26.0 - 34.0 pg   MCHC 33.9 30.0 - 36.0 g/dL   RDW 12.6 11.5 - 15.5 %   Platelets 340 150 - 400 K/uL  I-Stat Beta hCG blood, ED ([facility], [facility], [facility] only)     Status: None   Collection Time: 02/23/17 10:18 PM  Result Value Ref Range   I-stat hCG, quantitative <5.0 <5 mIU/mL   Comment 3            Comment:   GEST. AGE      CONC.  (mIU/mL)   <=1 WEEK        5 - 50     2 WEEKS       50 - 500     3 WEEKS       100 - 10,000     4 WEEKS     1,000 - 30,000        FEMALE AND NON-PREGNANT FEMALE:     LESS THAN 5 mIU/mL   Rapid urine drug screen (hospital performed)     Status: None   Collection Time: 02/24/17 12:03 AM  Result Value Ref Range   Opiates NONE DETECTED NONE DETECTED   Cocaine NONE DETECTED NONE DETECTED   Benzodiazepines NONE DETECTED NONE  DETECTED   Amphetamines NONE DETECTED NONE DETECTED   Tetrahydrocannabinol NONE DETECTED NONE DETECTED   Barbiturates NONE DETECTED NONE DETECTED    Comment:        DRUG SCREEN FOR MEDICAL PURPOSES ONLY.  IF CONFIRMATION IS NEEDED FOR ANY PURPOSE, NOTIFY LAB WITHIN 5 DAYS.        LOWEST DETECTABLE LIMITS FOR URINE DRUG SCREEN Drug Class       Cutoff (ng/mL) Amphetamine      1000 Barbiturate      200 Benzodiazepine   626 Tricyclics       948 Opiates          300 Cocaine          300 THC              50   Acetaminophen level     Status: Abnormal   Collection Time: 02/24/17  2:00 AM  Result Value Ref Range   Acetaminophen (Tylenol), Serum <10 (L) 10 - 30 ug/mL    Comment:        THERAPEUTIC CONCENTRATIONS VARY SIGNIFICANTLY. A RANGE OF 10-30 ug/mL MAY BE AN EFFECTIVE CONCENTRATION FOR MANY PATIENTS. HOWEVER, SOME ARE BEST TREATED AT CONCENTRATIONS OUTSIDE THIS RANGE. ACETAMINOPHEN CONCENTRATIONS >150 ug/mL AT 4 HOURS AFTER INGESTION AND >50 ug/mL AT 12 HOURS AFTER INGESTION ARE OFTEN ASSOCIATED WITH TOXIC REACTIONS.   Comprehensive metabolic panel     Status: Abnormal   Collection Time: 02/24/17  2:00 AM  Result Value Ref Range   Sodium 138 135 - 145 mmol/L   Potassium 3.6 3.5 - 5.1 mmol/L   Chloride 107 101 - 111 mmol/L   CO2 23 22 - 32 mmol/L   Glucose, Bld 92 65 - 99 mg/dL   BUN 7 6 - 20 mg/dL   Creatinine, Ser 0.89 0.44 -  1.00 mg/dL   Calcium 9.0 8.9 - 10.3 mg/dL   Total Protein 6.3 (L) 6.5 - 8.1 g/dL   Albumin 3.7 3.5 - 5.0 g/dL   AST 18 15 - 41 U/L   ALT 14 14 - 54 U/L   Alkaline Phosphatase 60 38 - 126 U/L   Total Bilirubin 0.6 0.3 - 1.2 mg/dL   GFR calc non Af Amer >60 >60 mL/min   GFR calc Af Amer >60 >60 mL/min    Comment: (NOTE) The eGFR has been calculated using the CKD EPI equation. This calculation has not been validated in all clinical situations. eGFR's persistently <60 mL/min signify possible Chronic Kidney Disease.    Anion gap 8 5 - 15     Blood Alcohol level:  Lab Results  Component Value Date   ETH <5 02/23/2017   ETH <11 73/22/0254    Metabolic Disorder Labs:  No results found for: HGBA1C, MPG No results found for: PROLACTIN No results found for: CHOL, TRIG, HDL, CHOLHDL, VLDL, LDLCALC  Current Medications: Current Facility-Administered Medications  Medication Dose Route Frequency Provider Last Rate Last Dose  . acetaminophen (TYLENOL) tablet 650 mg  650 mg Oral Q6H PRN Patrecia Pour, NP      . alum & mag hydroxide-simeth (MAALOX/MYLANTA) 200-200-20 MG/5ML suspension 30 mL  30 mL Oral Q4H PRN Patrecia Pour, NP      . FLUoxetine (PROZAC) capsule 10 mg  10 mg Oral Daily Patrecia Pour, NP   10 mg at 02/25/17 2706  . magnesium hydroxide (MILK OF MAGNESIA) suspension 30 mL  30 mL Oral Daily PRN Patrecia Pour, NP      . traZODone (DESYREL) tablet 50 mg  50 mg Oral QHS,MR X 1 Laverle Hobby, PA-C   50 mg at 02/24/17 2223   PTA Medications: Prescriptions Prior to Admission  Medication Sig Dispense Refill Last Dose  . albuterol (PROVENTIL HFA;VENTOLIN HFA) 108 (90 BASE) MCG/ACT inhaler Inhale 2 puffs into the lungs every 4 (four) hours as needed for wheezing. 1 Inhaler 1 not used  . EPINEPHrine (EPIPEN) 0.3 mg/0.3 mL IJ SOAJ injection Inject 0.3 mLs (0.3 mg total) into the muscle once. 1 Device 0 not used    Musculoskeletal: Strength & Muscle Tone: within normal limits Gait & Station: normal Patient leans: N/A  Psychiatric Specialty Exam: Physical Exam  Review of Systems  Constitutional: Negative.   HENT: Negative.   Respiratory: Negative.   Cardiovascular: Negative.   Gastrointestinal: Negative for abdominal pain, blood in stool, constipation, diarrhea, heartburn, melena, nausea and vomiting.  Genitourinary: Negative.   Musculoskeletal: Negative.   Skin: Negative.   Neurological: Negative for seizures.  Endo/Heme/Allergies: Negative.   Psychiatric/Behavioral: Positive for suicidal ideas.  All  other systems reviewed and are negative.   Blood pressure (!) 105/53, pulse (!) 59, temperature 98.2 F (36.8 C), temperature source Oral, resp. rate 16, height 5' 4.5" (1.638 m), weight 85.5 kg (188 lb 8 oz), SpO2 100 %.Body mass index is 31.86 kg/m.  General Appearance: Fairly Groomed  Eye Contact:  Good  Speech:  Normal Rate  Volume:  Normal  Mood:  states she feels "OK", and minimizes depression at this time  Affect:  Appropriate and reactive  Thought Process:  Linear and Descriptions of Associations: Intact  Orientation:  Full (Time, Place, and Person)  Thought Content:  denies hallucinations, no delusions, not internally preoccupied   Suicidal Thoughts:  No denies any suicidal or self injurious ideations, contracts for safety  on unit, denies homicidal or violent ideations   Homicidal Thoughts:  No  Memory:  recent and remote grossly intact   Judgement:  Other:  fair  Insight:  Fair  Psychomotor Activity:  Normal  Concentration:  Concentration: Good and Attention Span: Good  Recall:  Good  Fund of Knowledge:  Good  Language:  Good  Akathisia:  Negative  Handed:  Left  AIMS (if indicated):     Assets:  Communication Skills Desire for Improvement Resilience  ADL's:  Intact  Cognition:  WNL  Sleep:  Number of Hours: 5.75    Treatment Plan Summary: Daily contact with patient to assess and evaluate symptoms and progress in treatment, Medication management, Plan inpatient treatment and medications as below  Observation Level/Precautions:  15 minute checks  Laboratory:  As needed   Psychotherapy:  milieu  Medications:  She was started on Prozac upon admission to hospital, states she has taken 2 doses thus far, denies side effects. Wants to continue taking it, as she feels it may be helping. PROZAC 10 mgr QDAY   Consultations:  As needed   Discharge Concerns:  -  Estimated LOS: 4  days   Other:     Physician Treatment Plan for Primary Diagnosis: Suicide Attempt Long Term  Goal(s): Improvement in symptoms so as ready for discharge  Short Term Goals: Ability to verbalize feelings will improve, Ability to disclose and discuss suicidal ideas, Ability to demonstrate self-control will improve, Ability to identify and develop effective coping behaviors will improve, Ability to maintain clinical measurements within normal limits will improve and Compliance with prescribed medications will improve  Physician Treatment Plan for Secondary Diagnosis: Depression Long Term Goal(s): Improvement in symptoms so as ready for discharge  Short Term Goals: Ability to identify changes in lifestyle to reduce recurrence of condition will improve, Ability to verbalize feelings will improve, Ability to disclose and discuss suicidal ideas, Ability to demonstrate self-control will improve, Ability to identify and develop effective coping behaviors will improve, Ability to maintain clinical measurements within normal limits will improve and Compliance with prescribed medications will improve  I certify that inpatient services furnished can reasonably be expected to improve the patient's condition.    Jenne Campus, MD 8/1/201810:50 AM

## 2017-02-26 MED ORDER — FLUOXETINE HCL 20 MG PO CAPS
20.0000 mg | ORAL_CAPSULE | Freq: Every day | ORAL | Status: DC
  Administered 2017-02-27: 20 mg via ORAL
  Filled 2017-02-26 (×3): qty 1

## 2017-02-26 MED ORDER — TRAZODONE HCL 50 MG PO TABS
50.0000 mg | ORAL_TABLET | Freq: Every evening | ORAL | Status: DC | PRN
  Administered 2017-02-26: 50 mg via ORAL
  Filled 2017-02-26: qty 7

## 2017-02-26 NOTE — Plan of Care (Signed)
Problem: Safety: Goal: Periods of time without injury will increase Outcome: Progressing Pt denies SI. No self harm injuries observed.

## 2017-02-26 NOTE — Progress Notes (Signed)
Nursing Progress Note: 7p-7a D: Pt currently presents with a pleasant/childlike/silly affect and behavior. Interacting appropriately with milieu. Pt reports good sleep during the previous night with current medication regimen.   A: Pt provided with medications per providers orders. Pt's labs and vitals were monitored throughout the night. Pt supported emotionally and encouraged to express concerns and questions. Pt educated on medications.  R: Pt's safety ensured with 15 minute and environmental checks. Pt currently denies SI, HI, and AVH. Pt verbally contracts to seek staff if SI,HI, or AVH occurs and to consult with staff before acting on any harmful thoughts. Will continue to monitor.

## 2017-02-26 NOTE — [facility] Group Notes (Signed)
[facility] LCSW Group Therapy 02/26/2017 1:15pm  Type of Therapy: Group Therapy- Balance in Life  Participation Level: Active   Description of the Group:  The topic for group was balance in life. Today's group focused on defining balance in one's own words, identifying things that can knock one off balance, and exploring healthy ways to maintain balance in life. Group members were asked to provide an example of a time when they felt off balance, describe how they handled that situation,and process healthier ways to regain balance in the future. Group members were asked to share the most important tool for maintaining balance that [facility] and how they plan to apply this method after discharge.  Summary of Patient Progress Pt reports that she is feeling balanced today and that she was also feeling balanced yesterday as well. Pt states that the help that she has received in the hospital has been what she's needed to find balance. Pt is looking forward to discharge.    Therapeutic Modalities:   Cognitive Behavioral Therapy Solution-Focused Therapy Assertiveness Training   Laurie Sanders, MSW, LCSWA 02/26/2017 4:01 PM

## 2017-02-26 NOTE — [facility] Group Notes (Signed)
The focus of this group is to educate the patient on the purpose and policies of crisis stabilization and provide a format to answer questions about their admission.  The group details unit policies and expectations of patients while admitted.  Patient did not attend 0900 nurse education orientation group this morning.  Patient stayed in room.  
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
## 2017-02-26 NOTE — Progress Notes (Signed)
Objective: Pt presents with a flat affect and depressed mood. Pt has minimal interaction on the unit. Pt non compliant with attending scheduled groups. Pt noted to be tearful after meeting with MD. Pt observed stating "this is like being in Jail".   Subjective:  Pt reports decreased depression and anxiety. Pt stated that she has learned everything she had to work on and feels great. Pt reports fair sleep and appetite today. Pt denies SI.   Action: Medications reviewed with pt. Medications administered as ordered per MD. Verbal support provided. Pt encouraged to attend groups. 15 minute checks performed for safety.     Response: Pt verbalizes understanding of med regimen. No side effects to meds verbalized by pt. Pt compliant with treatment plan.  

## 2017-02-27 DIAGNOSIS — T50902A Poisoning by unspecified drugs, medicaments and biological substances, intentional self-harm, initial encounter: Secondary | ICD-10-CM

## 2017-02-27 MED ORDER — TRAZODONE HCL 50 MG PO TABS: 50.0000 mg | ORAL_TABLET | Freq: Every evening | ORAL | 0 refills | Status: DC | PRN

## 2017-02-27 MED ORDER — FLUOXETINE HCL 20 MG PO CAPS: 20.0000 mg | ORAL_CAPSULE | Freq: Every day | ORAL | 0 refills | Status: DC

## 2017-02-27 NOTE — Progress Notes (Signed)
  [facility] :  Will you be returning to the same living situation after discharge:  Yes,  pt returning home. At discharge, do you have transportation home?: Yes,  Pt's dad will transport. Do you have the ability to pay for your medications: Yes,  prescriptions and samples provided.  Release of information consent forms completed and in the chart;  Patient's signature needed at discharge.  Patient to Follow up at: Follow-up Information    [facility] Follow up.   Specialty:  Behavioral Health Why:  Please go for a walk-in appointment to be established for medication management. Walk-in hours are Mon-Fri 8am-3pm. Please arrive as early as possible to be sure that you are seen. Thank you. Contact information: [address] [place] [address] [phone]        [facility] Follow up on 03/06/2017.   Specialty:  Behavioral Health Why:  Therapy appointment 8/10 at 9am with Alimah. Please call the office if you need to cancel or reschedule your appointment. Contact information: [address] [place] [address] [phone]           Next level of care provider [facility] Link:no  Safety Planning and Suicide Prevention discussed: Yes,  with pt.  Have you used any form of tobacco in the last 30 days? (Cigarettes, Smokeless Tobacco, Cigars, and/or Pipes): Yes  Has patient been referred to the Quitline?: Patient refused referral  Patient has been referred for addiction treatment: Yes  Jonathon JordanLynn B Nethaniel Mattie, MSW, LCSWA 02/27/2017, 11:39 AM
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
## 2017-02-27 NOTE — Tx Team (Signed)
Interdisciplinary Treatment and Diagnostic Plan Update 02/27/2017 Time of Session: 9:30am  Laurie Sanders  MRN: [id number]  Principal Diagnosis: Major depressive disorder, recurrent severe without psychotic features (HCC)  Secondary Diagnoses: Principal Problem:   Major depressive disorder, recurrent severe without psychotic features (HCC)   Current Medications:  Current Facility-Administered Medications  Medication Dose Route Frequency Provider Last Rate Last Dose  . acetaminophen (TYLENOL) tablet 650 mg  650 mg Oral Q6H PRN Charm RingsLord, Jamison Y, NP      . alum & mag hydroxide-simeth (MAALOX/MYLANTA) 200-200-20 MG/5ML suspension 30 mL  30 mL Oral Q4H PRN Charm RingsLord, Jamison Y, NP      . FLUoxetine (PROZAC) capsule 20 mg  20 mg Oral Daily Cobos, Rockey SituFernando A, MD   20 mg at 02/27/17 0758  . magnesium hydroxide (MILK OF MAGNESIA) suspension 30 mL  30 mL Oral Daily PRN Charm RingsLord, Jamison Y, NP      . traZODone (DESYREL) tablet 50 mg  50 mg Oral QHS PRN Cobos, Rockey SituFernando A, MD   50 mg at 02/26/17 2056    PTA Medications: Prescriptions Prior to Admission  Medication Sig Dispense Refill Last Dose  . albuterol (PROVENTIL HFA;VENTOLIN HFA) 108 (90 BASE) MCG/ACT inhaler Inhale 2 puffs into the lungs every 4 (four) hours as needed for wheezing. 1 Inhaler 1 not used  . EPINEPHrine (EPIPEN) 0.3 mg/0.3 mL IJ SOAJ injection Inject 0.3 mLs (0.3 mg total) into the muscle once. 1 Device 0 not used    Treatment Modalities: Medication Management, Group therapy, Case management,  1 to 1 session with clinician, Psychoeducation, Recreational therapy.  Patient Stressors: Financial difficulties Marital or family conflict Occupational concerns Patient Strengths: Wellsite geologistCommunication skills General fund of knowledge Physical Health  Physician Treatment Plan for Primary Diagnosis: Major depressive disorder, recurrent severe without psychotic features (HCC) Long Term Goal(s): Improvement in symptoms so as ready for  discharge Short Term Goals: Ability to verbalize feelings will improve Ability to disclose and discuss suicidal ideas Ability to demonstrate self-control will improve Ability to identify and develop effective coping behaviors will improve Ability to maintain clinical measurements within normal limits will improve Compliance with prescribed medications will improve Ability to identify changes in lifestyle to reduce recurrence of condition will improve Ability to verbalize feelings will improve Ability to disclose and discuss suicidal ideas Ability to demonstrate self-control will improve Ability to identify and develop effective coping behaviors will improve Ability to maintain clinical measurements within normal limits will improve Compliance with prescribed medications will improve  Medication Management: Evaluate patient's response, side effects, and tolerance of medication regimen.  Therapeutic Interventions: 1 to 1 sessions, Unit Group sessions and Medication administration.  Evaluation of Outcomes: Adequate for Discharge  Physician Treatment Plan for Secondary Diagnosis: Principal Problem:   Major depressive disorder, recurrent severe without psychotic features (HCC)  Long Term Goal(s): Improvement in symptoms so as ready for discharge  Short Term Goals: Ability to verbalize feelings will improve Ability to disclose and discuss suicidal ideas Ability to demonstrate self-control will improve Ability to identify and develop effective coping behaviors will improve Ability to maintain clinical measurements within normal limits will improve Compliance with prescribed medications will improve Ability to identify changes in lifestyle to reduce recurrence of condition will improve Ability to verbalize feelings will improve Ability to disclose and discuss suicidal ideas Ability to demonstrate self-control will improve Ability to identify and develop effective coping behaviors will  improve Ability to maintain clinical measurements within normal limits will improve Compliance with prescribed medications will  improve  Medication Management: Evaluate patient's response, side effects, and tolerance of medication regimen.  Therapeutic Interventions: 1 to 1 sessions, Unit Group sessions and Medication administration.  Evaluation of Outcomes: Adequate for Discharge  RN Treatment Plan for Primary Diagnosis: Major depressive disorder, recurrent severe without psychotic features (HCC) Long Term Goal(s): Knowledge of disease and therapeutic regimen to maintain health will improve  Short Term Goals: Compliance with prescribed medications will improve  Medication Management: RN will administer medications as ordered by provider, will assess and evaluate patient's response and provide education to patient for prescribed medication. RN will report any adverse and/or side effects to prescribing provider.  Therapeutic Interventions: 1 on 1 counseling sessions, Psychoeducation, Medication administration, Evaluate responses to treatment, Monitor vital signs and CBGs as ordered, Perform/monitor CIWA, COWS, AIMS and Fall Risk screenings as ordered, Perform wound care treatments as ordered.  Evaluation of Outcomes: Adequate for Discharge  LCSW Treatment Plan for Primary Diagnosis: Major depressive disorder, recurrent severe without psychotic features (HCC) Long Term Goal(s): Safe transition to appropriate next level of care at discharge, Engage patient in therapeutic group addressing interpersonal concerns. Short Term Goals: Engage patient in aftercare planning with referrals and resources, Increase emotional regulation, Identify triggers associated with mental health/substance abuse issues and Increase skills for wellness and recovery  Therapeutic Interventions: Assess for all discharge needs, 1 to 1 time with Social worker, Explore available resources and support systems, Assess for  adequacy in community support network, Educate family and significant other(s) on suicide prevention, Complete Psychosocial Assessment, Interpersonal group therapy.  Evaluation of Outcomes: Adequate for Discharge  Progress in Treatment: Attending groups: Yes Participating in groups: Yes Taking medication as prescribed: Yes, MD continues to assess for medication changes as needed Toleration medication: Yes, no side effects reported at this time Family/Significant other contact made: No, pt declined contact. SPE discussed with pt. Patient understands diagnosis: Yes, AEB pt's willingness to participate in treatment. Discussing patient identified problems/goals with staff: Yes Medical problems stabilized or resolved: Yes Denies suicidal/homicidal ideation: Yes Issues/concerns per patient self-inventory: None Other: N/A  New problem(s) identified: None identified at this time.   New Short Term/Long Term Goal(s): None identified at this time.   Discharge Plan or Barriers: Pt will return home and [facility]  Reason for Continuation of Hospitalization:  None identified at this time.  Estimated Length of Stay: 0 days; Pt will likely discharge today 02/27/17.  Attendees: Patient: 02/27/2017 11:41 AM  Physician: Dr. Jama Flavorsobos 02/27/2017 11:41 AM  Nursing: Vella RaringPatty, RN; Marian, RN 02/27/2017 11:41 AM  RN Care Manager: 02/27/2017 11:41 AM  Social Worker: Donnelly StagerLynn Arleth Mccullar, LCSWA 02/27/2017 11:41 AM  Recreational Therapist:  02/27/2017 11:41 AM  Other: Armandina StammerAgnes Nwoko, NP 02/27/2017 11:41 AM  Other:  02/27/2017 11:41 AM  Other: 02/27/2017 11:41 AM  Scribe for Treatment Team: Jonathon JordanLynn B Quantasia Stegner, MSW,LCSWA 02/27/2017 11:41 AM

## 2017-02-27 NOTE — Progress Notes (Signed)
D: Patient observed up in hallway this AM however asking if she can return to bed after meds. Patient verbalizes to this Clinical research associatewriter she is discharging today and feels ready and prepared. Patient's affect appropriate with congruent mood. Per self inventory and discussions with writer, rates depression, hopelessness and anxiety all at a 0/10. Rates sleep as good, appetite as good, energy as normal and concentration as good.  In reference to today's goal she states, "there's really nothing to work on today. Everything is accomplished. I'm doing better and feeling great." Denies pain, physical problems.   A: Medicated per orders, no prns requested or required. Level III obs in place for safety. Emotional support offered and self inventory reviewed. Encouraged completion of Suicide Safety Plan and programming participation. Encouraged not to return to bed but rather stay up and visible, interact with peers and staff. Discussed POC with MD, SW.    R: Patient verbalizes understanding of POC. Patient denies SI/HI/AVH and remains safe on level III obs. Will continue to monitor closely and make verbal contact frequently. Awaiting discharge.

## 2017-02-27 NOTE — Progress Notes (Signed)
Recreation Therapy Notes   Date: 02/27/2017 Time: 9:30am Location: 300 Hall Dayroom  Group Topic: Stress Management  Goal Area(s) Addresses:  Patient will verbalize importance of using healthy stress management.  Patient will identify positive emotions associated with healthy stress management.   Behavioral Response: Engaged  Intervention: Stress Management  Activity :  Starry Night Visualization Relaxation. Recreation Therapy Intern introduced the stress management technique of visual relaxation . Recreation Therapy Intern read a script that allowed patients to take imagine being underneath a starry night sky. Recreation Therapy Intern played forest night time sounds. Patients were to follow along as script was read to engage in the activity.  Education: Stress Management, Discharge Planning.   Education Outcome: Acknowledges edcuation  Clinical Observations/Feedback: Pt attended group.  Rachel Meyer, Recreation Therapy Intern   Pearl Bents, LRT/CTRS 

## 2017-02-27 NOTE — Progress Notes (Addendum)
Patient verbalizes readiness for discharge. Follow up plan explained, AVS, transition record and SRA given along with prescriptions. Sample meds provided. All belongings returned. Patient verbalizes understanding. Denies SI/HI and assures this Clinical research associatewriter she will seek assistance should that change. Patient discharged ambulatory and in stable condition to her father.

## 2017-05-27 ENCOUNTER — Ambulatory Visit (INDEPENDENT_AMBULATORY_CARE_PROVIDER_SITE_OTHER): Payer: Medicaid Other

## 2017-05-27 DIAGNOSIS — N898 Other specified noninflammatory disorders of vagina: Secondary | ICD-10-CM

## 2017-05-27 DIAGNOSIS — Z113 Encounter for screening for infections with a predominantly sexual mode of transmission: Secondary | ICD-10-CM

## 2017-05-27 NOTE — Progress Notes (Signed)
Pt here today c/o vaginal discharge with odor.  Pt self-swab and informed pt that if results are abnormal we will give her a call with follow up.  Pt stated understanding with no further questions.

## 2017-05-29 LAB — CERVICOVAGINAL ANCILLARY ONLY
Bacterial vaginitis: POSITIVE — AB
CANDIDA VAGINITIS: NEGATIVE
CHLAMYDIA, DNA PROBE: POSITIVE — AB
Neisseria Gonorrhea: POSITIVE — AB
Trichomonas: NEGATIVE

## 2017-05-30 ENCOUNTER — Other Ambulatory Visit: Payer: Self-pay | Admitting: Family Medicine

## 2017-05-30 MED ORDER — METRONIDAZOLE 500 MG PO TABS: 500.0000 mg | ORAL_TABLET | Freq: Two times a day (BID) | ORAL | 0 refills | Status: DC

## 2017-06-01 ENCOUNTER — Ambulatory Visit (INDEPENDENT_AMBULATORY_CARE_PROVIDER_SITE_OTHER): Payer: Self-pay | Admitting: *Deleted

## 2017-06-01 ENCOUNTER — Telehealth: Payer: Self-pay

## 2017-06-01 DIAGNOSIS — A749 Chlamydial infection, unspecified: Secondary | ICD-10-CM

## 2017-06-01 DIAGNOSIS — A549 Gonococcal infection, unspecified: Secondary | ICD-10-CM

## 2017-06-01 MED ORDER — CEFTRIAXONE SODIUM 250 MG IJ SOLR
250.0000 mg | Freq: Once | INTRAMUSCULAR | Status: AC
  Administered 2017-06-01: 250 mg via INTRAMUSCULAR

## 2017-06-01 MED ORDER — AZITHROMYCIN 250 MG PO TABS
1000.0000 mg | ORAL_TABLET | Freq: Once | ORAL | Status: AC
  Administered 2017-06-01: 1000 mg via ORAL

## 2017-06-01 NOTE — Telephone Encounter (Signed)
Pt tested + for gonorrhea and chlamydia.  Dr. Adrian BlackwaterStinson sent Rx for Flagyl tx.  LM for pt that we need her to call the office in regards to results.   [facility]   Pt return call back.  I informed her of the results and the need for tx.  Pt stated that she would be able to come in before 4pm to receive tx.  Front office notified to put pt on schedule.

## 2017-06-01 NOTE — Progress Notes (Signed)
Pt to clinic for treatment of gonorrhea and chlamydia. Azithromycin 1000mg  po given, 250mg  rocephin im given and tolerated well. Advised pt to avoid sexual intercourse for 2 weeks and her partner should be treated as well. Understanding voiced.

## 2017-06-23 IMAGING — CT CT HEAD W/O CM
3 of 6 series · 9 of 32 positions shown, 10 images · non-contrast
Comparison: Head CT dated 09/16/13

CLINICAL DATA: 20-year-old female with trauma

EXAM:
CT HEAD WITHOUT CONTRAST
CT MAXILLOFACIAL WITHOUT CONTRAST
TECHNIQUE: Multidetector CT imaging of the head and maxillofacial structures
were performed using the standard protocol without intravenous
contrast. Multiplanar CT image reconstructions of the maxillofacial
structures were also generated.

[Series 602: <mpr thick range> · coronal · 0.31mm/px · 3 of 70 slices shown]
[im 24/70  brain]
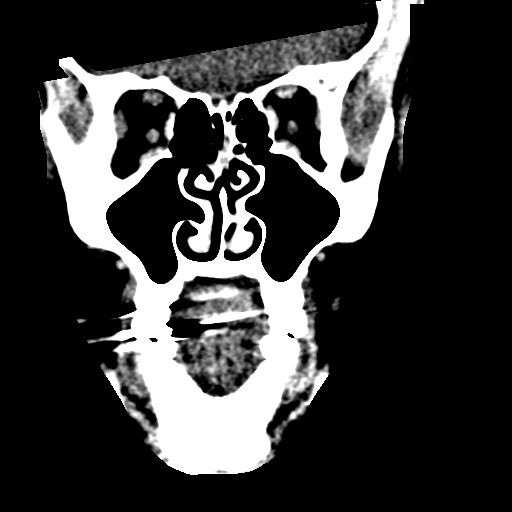
[im 28/70  brain]
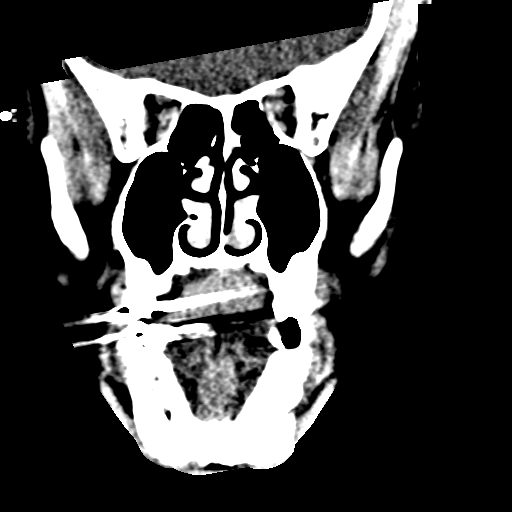
[im 42/70  brain]
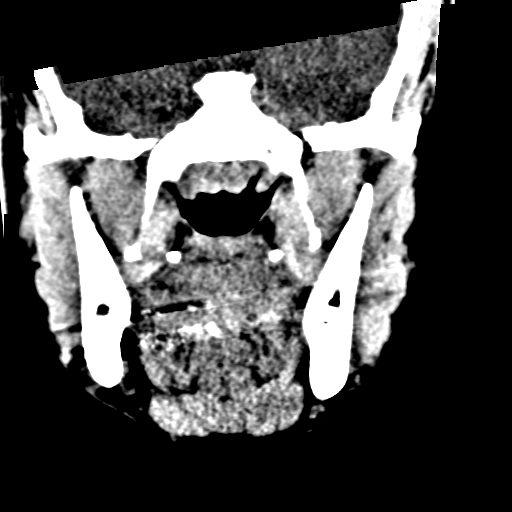

[Series 603: <mpr thick range(1)> · sagittal · 0.31mm/px · 3 of 69 slices shown, 4 images]
[im 18/69  brain]
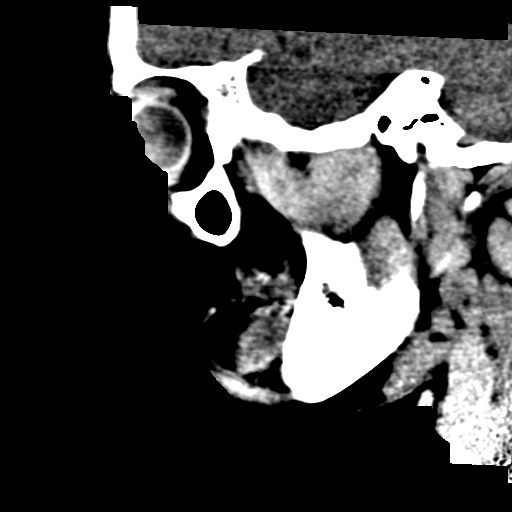
[im 18/69  bone]
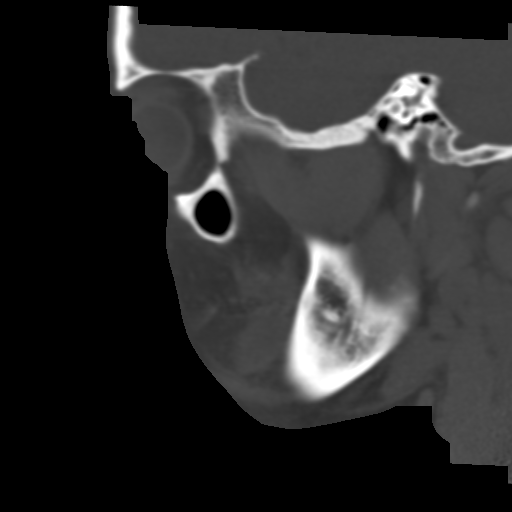
[im 35/69  brain]
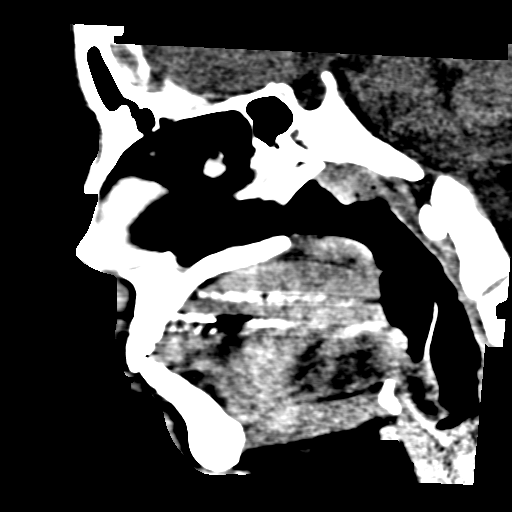
[im 52/69  brain]
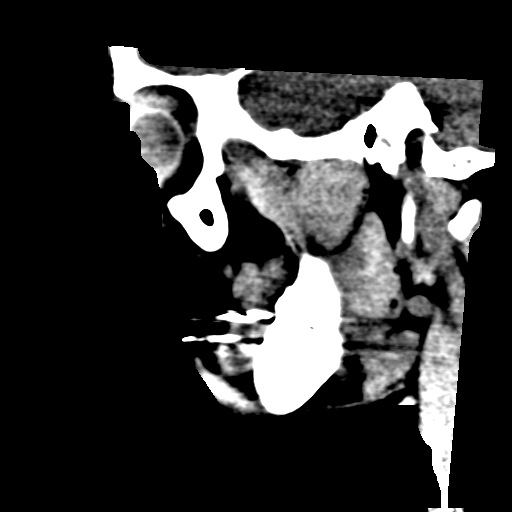

[Series 605: <mpr thick range(3)> · sagittal · 0.31mm/px · 3 of 69 slices shown]
[im 18/69  brain]
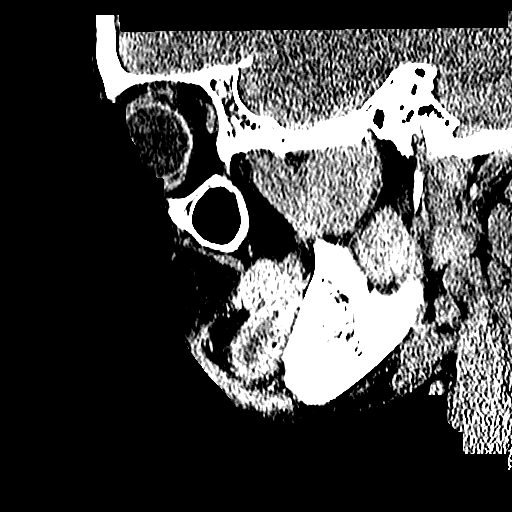
[im 35/69  brain]
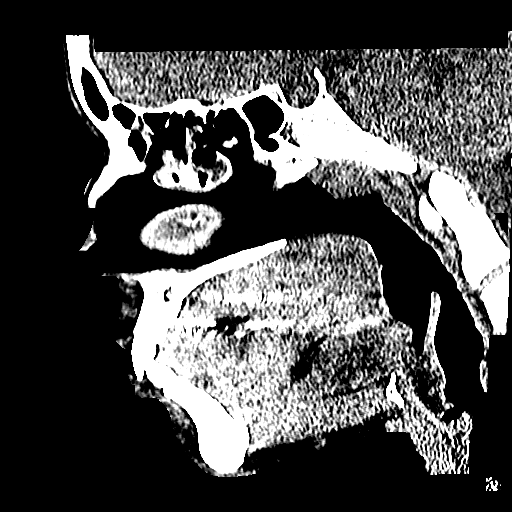
[im 52/69  brain]
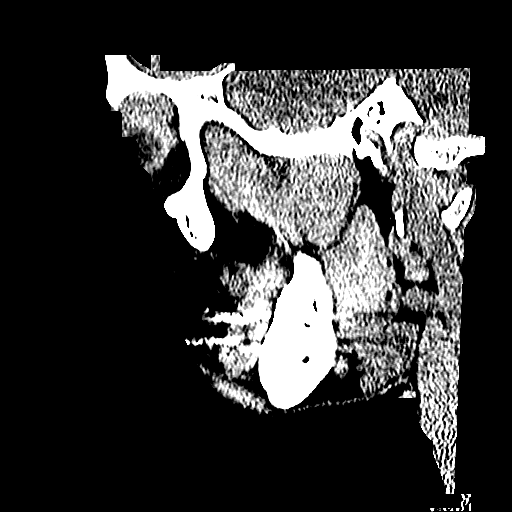

[9 of 32 positions shown; findings below may reference images not displayed]

FINDINGS: CT HEAD FINDINGS

The ventricles and the sulci are appropriate in size for the
patient's age. There is no intracranial hemorrhage. No midline shift
or mass effect identified. The gray-white matter differentiation is
preserved.

There is mild mucoperiosteal thickening of paranasal sinuses with
partial opacification of the ethmoid air cells. No air-fluid levels.
The mastoid air cells are clear. The calvarium is intact

CT MAXILLOFACIAL FINDINGS

There is no acute fracture of the facial bone. Maxilla mandible and
pterygoid plates are intact. The globes, retro-orbital fat, and
orbital walls are preserved. There is dental caries of the left
mandibular first molar tooth with lucency of the alveolar bone
adjacent to the root. Correlation with dental exam recommended.
There is skin laceration over the bridge of the nose. No hematoma.
IMPRESSION: No acute intracranial pathology.

No acute/ traumatic facial bone fractures.

Dental caries of the left mandibular first molar tooth.

## 2017-08-14 ENCOUNTER — Other Ambulatory Visit: Payer: Self-pay | Admitting: Occupational Medicine

## 2017-08-14 ENCOUNTER — Ambulatory Visit: Payer: Self-pay

## 2017-08-14 DIAGNOSIS — M79671 Pain in right foot: Secondary | ICD-10-CM

## 2017-08-14 DIAGNOSIS — M79672 Pain in left foot: Secondary | ICD-10-CM

## 2017-09-23 ENCOUNTER — Other Ambulatory Visit: Payer: Self-pay

## 2017-09-23 ENCOUNTER — Encounter (HOSPITAL_COMMUNITY): Payer: Self-pay | Admitting: Emergency Medicine

## 2017-09-23 ENCOUNTER — Emergency Department (HOSPITAL_COMMUNITY)
Admission: EM | Admit: 2017-09-23 | Discharge: 2017-09-23 | Disposition: A | Discharge status: Home / Self Care | Payer: Self-pay | Attending: Emergency Medicine | Admitting: Emergency Medicine

## 2017-09-23 ENCOUNTER — Emergency Department (HOSPITAL_COMMUNITY): Payer: Self-pay

## 2017-09-23 DIAGNOSIS — Z79899 Other long term (current) drug therapy: Secondary | ICD-10-CM | POA: Insufficient documentation

## 2017-09-23 DIAGNOSIS — R1012 Left upper quadrant pain: Secondary | ICD-10-CM | POA: Insufficient documentation

## 2017-09-23 DIAGNOSIS — R1032 Left lower quadrant pain: Secondary | ICD-10-CM | POA: Insufficient documentation

## 2017-09-23 DIAGNOSIS — F17210 Nicotine dependence, cigarettes, uncomplicated: Secondary | ICD-10-CM | POA: Insufficient documentation

## 2017-09-23 DIAGNOSIS — K5900 Constipation, unspecified: Secondary | ICD-10-CM | POA: Insufficient documentation

## 2017-09-23 DIAGNOSIS — J45909 Unspecified asthma, uncomplicated: Secondary | ICD-10-CM | POA: Insufficient documentation

## 2017-09-23 LAB — URINALYSIS, ROUTINE W REFLEX MICROSCOPIC
Bacteria, UA: NONE SEEN
Bilirubin Urine: NEGATIVE
GLUCOSE, UA: NEGATIVE mg/dL
Hgb urine dipstick: NEGATIVE
KETONES UR: 5 mg/dL — AB
Nitrite: NEGATIVE
PH: 5 (ref 5.0–8.0)
Protein, ur: NEGATIVE mg/dL
Specific Gravity, Urine: 1.031 — ABNORMAL HIGH (ref 1.005–1.030)

## 2017-09-23 LAB — CBC
HCT: 39.8 % (ref 36.0–46.0)
Hemoglobin: 13.1 g/dL (ref 12.0–15.0)
MCH: 32.6 pg (ref 26.0–34.0)
MCHC: 32.9 g/dL (ref 30.0–36.0)
MCV: 99 fL (ref 78.0–100.0)
PLATELETS: 376 10*3/uL (ref 150–400)
RBC: 4.02 MIL/uL (ref 3.87–5.11)
RDW: 12.9 % (ref 11.5–15.5)
WBC: 10 10*3/uL (ref 4.0–10.5)

## 2017-09-23 LAB — I-STAT BETA HCG BLOOD, ED (MC, WL, AP ONLY): I-stat hCG, quantitative: <5 m[IU]/mL (ref <5)

## 2017-09-23 LAB — LIPASE, BLOOD: Lipase: 24 U/L (ref 11–51)

## 2017-09-23 LAB — COMPREHENSIVE METABOLIC PANEL
ALK PHOS: 74 U/L (ref 38–126)
ALT: 10 U/L — AB (ref 14–54)
AST: 19 U/L (ref 15–41)
Albumin: 3.7 g/dL (ref 3.5–5.0)
Anion gap: 11 (ref 5–15)
BILIRUBIN TOTAL: 0.4 mg/dL (ref 0.3–1.2)
BUN: 6 mg/dL (ref 6–20)
CALCIUM: 9.1 mg/dL (ref 8.9–10.3)
CO2: 21 mmol/L — AB (ref 22–32)
CREATININE: 0.82 mg/dL (ref 0.44–1.00)
Chloride: 109 mmol/L (ref 101–111)
GFR calc Af Amer: >60 mL/min (ref >60)
Glucose, Bld: 90 mg/dL (ref 65–99)
Potassium: 3.5 mmol/L (ref 3.5–5.1)
Sodium: 141 mmol/L (ref 135–145)
TOTAL PROTEIN: 6.7 g/dL (ref 6.5–8.1)

## 2017-09-23 NOTE — ED Triage Notes (Signed)
Pt reports LLQ abd pain present X2 days. Reports constipation, denies N/V/D. Denies GU S/S.

## 2017-09-23 NOTE — Discharge Instructions (Signed)
Recommend starting miralax or colace daily-- both of these are over the counter, can follow package instructions.   Would use daily until bowel movements get back to normal, then can just use when needed. Follow-up with your primary care doctor. Return here for any new/acute changes.

## 2017-09-23 NOTE — ED Notes (Signed)
Family at bedside. 

## 2017-09-23 NOTE — ED Provider Notes (Signed)
[facility] Provider Note   CSN: [id number] Arrival date & time: 09/23/17  0140     History   Chief Complaint Chief Complaint  Patient presents with  . Abdominal Pain    HPI Laurie Sanders is a 22 y.o. female.  The history is provided by the patient and medical records.  Abdominal Pain   Associated symptoms include constipation.     22 year old female with history of asthma, depression, presenting to the ED with abdominal pain.  Reports of the past 2 days she has had intermittent abdominal pain.  Initially it started in her left lower abdomen, but now she feels it somewhat in the left upper abdomen as well.  Reports her abdomen feels very "full" and bloated.  States she is able to eat but feels full very quickly.  She has not had a bowel movement in about 4 days, usually goes at least once a day.  She denies any vomiting.  She is able to pass gas when needed.  Has had prior C-section, no other abdominal surgeries.  No meds tried PTA.  Past Medical History:  Diagnosis Date  . Asthma   . Preterm premature rupture of membranes in second trimester 04/29/2011    Patient Active Problem List   Diagnosis Date Noted  . Intentional drug overdose (HCC)   . Major depressive disorder without psychotic features 02/24/2017  . 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 Respiratory failure (HCC) 06/11/2012  . Asthma exacerbation 06/11/2012  . Asthma with status asthmaticus 06/11/2012  . Status post cesarean delivery 06/16/2011    Past Surgical History:  Procedure Laterality Date  . CESAREAN SECTION  05/09/2011   Procedure: CESAREAN SECTION;  Surgeon: Catalina AntiguaPeggy Constant, MD;  Location: [facility];  Service: Gynecology;  Laterality: N/A;  . NO PAST SURGERIES      OB History    Gravida Para Term Preterm AB Living   1 1 0 1 0 1   SAB TAB Ectopic Multiple Live Births   0 0 0 0 1       Home  Medications    Prior to Admission medications   Medication Sig Start Date End Date Taking? Authorizing Provider  albuterol (PROVENTIL HFA;VENTOLIN HFA) 108 (90 BASE) MCG/ACT inhaler Inhale 2 puffs into the lungs every 4 (four) hours as needed for wheezing. 06/13/12   Cioffredi, Leigh-Anne, MD  EPINEPHrine (EPIPEN) 0.3 mg/0.3 mL IJ SOAJ injection Inject 0.3 mLs (0.3 mg total) into the muscle once. 03/02/14   Marcellina MillinGaley, Timothy, MD  FLUoxetine (PROZAC) 20 MG capsule Take 1 capsule (20 mg total) by mouth daily. 02/28/17   Oneta RackLewis, Tanika N, NP  metroNIDAZOLE (FLAGYL) 500 MG tablet Take 1 tablet (500 mg total) by mouth 2 (two) times daily. 05/30/17   Levie HeritageStinson, Jacob J, DO  traZODone (DESYREL) 50 MG tablet Take 1 tablet (50 mg total) by mouth at bedtime as needed for sleep. 02/27/17   Oneta RackLewis, Tanika N, NP    Family History No family history on file.  Social History Social History   Tobacco Use  . Smoking status: Current Every Day Smoker    Packs/day: 0.50    Types: Cigarettes  . Smokeless tobacco: Never Used  Substance Use Topics  . Alcohol use: No  . Drug use: No     Allergies   Benadryl [diphenhydramine hcl] and Kiwi extract   Review of Systems Review of Systems  Gastrointestinal: Positive for abdominal pain and constipation.  All other systems reviewed and are negative.    Physical Exam Updated Vital Signs BP 104/63   Pulse 84   Temp 98.2 F (36.8 C) (Oral)   Resp 16   Ht 5\' 5"  (1.651 m)   Wt 77.6 kg (171 lb)   SpO2 94%   BMI 28.46 kg/m   Physical Exam  Constitutional: She is oriented to person, place, and time. She appears well-developed and well-nourished.  HENT:  Head: Normocephalic and atraumatic.  Mouth/Throat: Oropharynx is clear and moist.  Eyes: Conjunctivae and EOM are normal. Pupils are equal, round, and reactive to light.  Neck: Normal range of motion.  Cardiovascular: Normal rate, regular rhythm and normal heart sounds.  Pulmonary/Chest: Effort normal and breath  sounds normal.  Abdominal: Soft. Bowel sounds are normal. There is no tenderness. There is no rigidity and no guarding.  Musculoskeletal: Normal range of motion.  Neurological: She is alert and oriented to person, place, and time.  Skin: Skin is warm and dry.  Psychiatric: She has a normal mood and affect.  Nursing note and vitals reviewed.    ED Treatments / Results  Labs (all labs ordered are listed, but only abnormal results are displayed) Labs Reviewed  COMPREHENSIVE METABOLIC PANEL - Abnormal; Notable for the following components:      Result Value   CO2 21 (*)    ALT 10 (*)    All other components within normal limits  URINALYSIS, ROUTINE W REFLEX MICROSCOPIC - Abnormal; Notable for the following components:   Color, Urine AMBER (*)    APPearance HAZY (*)    Specific Gravity, Urine 1.031 (*)    Ketones, ur 5 (*)    Leukocytes, UA TRACE (*)    Squamous Epithelial / LPF 6-30 (*)    All other components within normal limits  LIPASE, BLOOD  CBC  I-STAT BETA HCG BLOOD, ED ([facility], [facility], [facility] ONLY)    EKG  EKG Interpretation None       Radiology Dg Abd Acute W/chest  Result Date: 09/23/2017 CLINICAL DATA:  22 year old female with abdominal pain. EXAM: DG ABDOMEN ACUTE W/ 1V CHEST COMPARISON:  Chest radiograph dated 06/11/2012 FINDINGS: The lungs are clear. There is no pleural effusion or pneumothorax. The cardiac silhouette is within normal limits. There is no bowel dilatation or evidence of obstruction. No free air or radiopaque calculi. The osseous structures and soft tissues are unremarkable. IMPRESSION: Negative abdominal radiographs.  No acute cardiopulmonary disease. Electronically Signed   By: Elgie Collard M.D.   On: 09/23/2017 05:22    Procedures Procedures (including critical care time)  Medications Ordered in ED Medications - No data to display   Initial Impression / Assessment and Plan / ED Course  I have reviewed the triage vital signs and the nursing  notes.  Pertinent labs & imaging results that were available during my care of the patient were reviewed by me and considered in my medical decision making (see chart for details).  22 year old female here with abdominal pain for the past 2 days.  Initially left lower, now somewhat in the left upper as well.  She is afebrile and nontoxic.  Abdomen is soft and benign.  Screening lab work overall reassuring.  Reports no bowel movement in the past 4 days, is able to pass gas.  She has not had any nausea or vomiting but gets full very quickly when eating.  Suspect some component of constipation.  Will obtain acute abdominal series.  Imaging without acute obstructive  pathology or free air.  Feel she can be safely discharged home.  Will start bowel regimen.  Close follow-up with PCP.  Discussed plan with patient, she acknowledged understanding and agreed with plan of care.  Return precautions given for new or worsening symptoms.  Final Clinical Impressions(s) / ED Diagnoses   Final diagnoses:  Left lower quadrant pain    ED Discharge Orders    None       Garlon Hatchet, PA-C 09/23/17 7829    Gilda Crease, MD 09/25/17 [phone]

## 2018-03-22 ENCOUNTER — Ambulatory Visit: Payer: Self-pay

## 2018-03-31 ENCOUNTER — Encounter (HOSPITAL_COMMUNITY): Payer: Self-pay

## 2018-03-31 ENCOUNTER — Ambulatory Visit (HOSPITAL_COMMUNITY)
Admission: EM | Admit: 2018-03-31 | Discharge: 2018-03-31 | Disposition: A | Discharge status: Home / Self Care | Payer: Self-pay | Attending: Family Medicine | Admitting: Family Medicine

## 2018-03-31 DIAGNOSIS — Z3201 Encounter for pregnancy test, result positive: Secondary | ICD-10-CM

## 2018-03-31 LAB — POCT PREGNANCY, URINE: Preg Test, Ur: POSITIVE — AB

## 2018-03-31 NOTE — ED Provider Notes (Signed)
[facility]    CSN: [id number] Arrival date & time: 03/31/18  1449     History   Chief Complaint Chief Complaint  Patient presents with  . Possible Pregnancy    HPI Laurie Sanders is a 22 y.o. female.   22 year old female comes in for pregnancy test.  States she took 2 at home, but "I do not trust the ones from boxes".  She brought the tests, they were both positive.  LMP 02/17/2018.  Denies abdominal pain, nausea, vomiting.  Denies fever, chills, night sweats.  Denies vaginal bleeding.     Past Medical History:  Diagnosis Date  . Asthma   . Preterm premature rupture of membranes in second trimester 04/29/2011    Patient Active Problem List   Diagnosis Date Noted  . Intentional drug overdose (HCC)   . Major depressive disorder without psychotic features 02/24/2017  . 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 Respiratory failure (HCC) 06/11/2012  . Asthma exacerbation 06/11/2012  . Asthma with status asthmaticus 06/11/2012  . Status post cesarean delivery 06/16/2011    Past Surgical History:  Procedure Laterality Date  . CESAREAN SECTION  05/09/2011   Procedure: CESAREAN SECTION;  Surgeon: Catalina Antigua, MD;  Location: [facility];  Service: Gynecology;  Laterality: N/A;  . NO PAST SURGERIES      OB History    Gravida  1   Para  1   Term  0   Preterm  1   AB  0   Living  1     SAB  0   TAB  0   Ectopic  0   Multiple  0   Live Births  1            Home Medications    Prior to Admission medications   Medication Sig Start Date End Date Taking? Authorizing Provider  albuterol (PROVENTIL HFA;VENTOLIN HFA) 108 (90 BASE) MCG/ACT inhaler Inhale 2 puffs into the lungs every 4 (four) hours as needed for wheezing. Patient not taking: Reported on 09/23/2017 06/13/12   Cioffredi, Candelaria Stagers, MD  EPINEPHrine (EPIPEN) 0.3 mg/0.3 mL IJ SOAJ injection Inject 0.3 mLs (0.3 mg total) into the muscle  once. Patient taking differently: Inject 0.3 mg into the muscle as needed (for allergic reaction).  03/02/14   Marcellina Millin, MD  FLUoxetine (PROZAC) 20 MG capsule Take 1 capsule (20 mg total) by mouth daily. Patient not taking: Reported on 09/23/2017 02/28/17   Oneta Rack, NP  metroNIDAZOLE (FLAGYL) 500 MG tablet Take 1 tablet (500 mg total) by mouth 2 (two) times daily. Patient not taking: Reported on 09/23/2017 05/30/17   Levie Heritage, DO  traZODone (DESYREL) 50 MG tablet Take 1 tablet (50 mg total) by mouth at bedtime as needed for sleep. Patient not taking: Reported on 09/23/2017 02/27/17   Oneta Rack, NP    Family History History reviewed. No pertinent family history.  Social History Social History   Tobacco Use  . Smoking status: Current Every Day Smoker    Packs/day: 0.50    Types: Cigarettes  . Smokeless tobacco: Never Used  Substance Use Topics  . Alcohol use: No  . Drug use: No     Allergies   Benadryl [diphenhydramine hcl] and Kiwi extract   Review of Systems Review of Systems  Reason unable to perform ROS: See HPI as above.     Physical Exam Triage Vital Signs ED Triage Vitals  Enc Vitals Group  BP 03/31/18 1530 (!) 101/50     Pulse Rate 03/31/18 1530 87     Resp 03/31/18 1530 20     Temp 03/31/18 1530 98.3 F (36.8 C)     Temp Source 03/31/18 1530 Oral     SpO2 03/31/18 1530 100 %     Weight --      Height --      Head Circumference --      Peak Flow --      Pain Score 03/31/18 1527 0     Pain Loc --      Pain Edu? --      Excl. in GC? --    No data found.  Updated Vital Signs BP (!) 101/50 (BP Location: Right Arm)   Pulse 87   Temp 98.3 F (36.8 C) (Oral)   Resp 20   LMP 02/17/2018   SpO2 100%   Physical Exam  Constitutional: She is oriented to person, place, and time. She appears well-developed and well-nourished. No distress.  HENT:  Head: Normocephalic and atraumatic.  Eyes: Pupils are equal, round, and reactive to  light. Conjunctivae are normal.  Cardiovascular: Normal rate, regular rhythm and normal heart sounds. Exam reveals no gallop and no friction rub.  No murmur heard. Pulmonary/Chest: Effort normal and breath sounds normal. No stridor. No respiratory distress. She has no wheezes. She has no rales.  Abdominal: Soft. Bowel sounds are normal. She exhibits no mass. There is no tenderness. There is no rebound, no guarding and no CVA tenderness.  Neurological: She is alert and oriented to person, place, and time.  Skin: Skin is warm and dry.  Psychiatric: She has a normal mood and affect. Her behavior is normal. Judgment normal.    UC Treatments / Results  Labs (all labs ordered are listed, but only abnormal results are displayed) Labs Reviewed  POCT PREGNANCY, URINE - Abnormal; Notable for the following components:      Result Value   Preg Test, Ur POSITIVE (*)    All other components within normal limits    EKG None  Radiology No results found.  Procedures Procedures (including critical care time)  Medications Ordered in UC Medications - No data to display  Initial Impression / Assessment and Plan / UC Course  I have reviewed the triage vital signs and the nursing notes.  Pertinent labs & imaging results that were available during my care of the patient were reviewed by me and considered in my medical decision making (see chart for details).    Urine pregnancy positive.  Start prenatal vitamins.  Patient to follow-up with OB/GYN for further evaluation.  Return precautions given.  Final Clinical Impressions(s) / UC Diagnoses   Final diagnoses:  Positive urine pregnancy test   ED Prescriptions    None        Belinda Fisher, PA-C 03/31/18 1603

## 2018-03-31 NOTE — Discharge Instructions (Signed)
Pregnancy test positive.  Start prenatal vitamins.  Avoid alcohol, ibuprofen.  Follow-up with OB/GYN for further evaluation and management needed.  If experiencing abdominal pain, vaginal bleeding, go to the [facility] emergency department for further evaluation.

## 2018-03-31 NOTE — ED Triage Notes (Signed)
Pt presents to clarify possible pregnancy .

## 2018-04-29 ENCOUNTER — Encounter: Payer: Self-pay | Admitting: Student

## 2018-04-30 ENCOUNTER — Encounter: Payer: Self-pay | Admitting: Student

## 2018-04-30 ENCOUNTER — Encounter: Payer: Self-pay | Admitting: *Deleted

## 2018-04-30 ENCOUNTER — Ambulatory Visit (INDEPENDENT_AMBULATORY_CARE_PROVIDER_SITE_OTHER): Payer: Medicaid Other | Admitting: Student

## 2018-04-30 ENCOUNTER — Ambulatory Visit: Payer: Self-pay | Admitting: Clinical

## 2018-04-30 VITALS — BP 111/52 | HR 67 | Wt 169.8 lb

## 2018-04-30 DIAGNOSIS — O0990 Supervision of high risk pregnancy, unspecified, unspecified trimester: Secondary | ICD-10-CM | POA: Insufficient documentation

## 2018-04-30 DIAGNOSIS — O09211 Supervision of pregnancy with history of pre-term labor, first trimester: Secondary | ICD-10-CM

## 2018-04-30 DIAGNOSIS — O219 Vomiting of pregnancy, unspecified: Secondary | ICD-10-CM

## 2018-04-30 DIAGNOSIS — Z113 Encounter for screening for infections with a predominantly sexual mode of transmission: Secondary | ICD-10-CM

## 2018-04-30 DIAGNOSIS — O0991 Supervision of high risk pregnancy, unspecified, first trimester: Secondary | ICD-10-CM

## 2018-04-30 DIAGNOSIS — O09891 Supervision of other high risk pregnancies, first trimester: Secondary | ICD-10-CM | POA: Insufficient documentation

## 2018-04-30 DIAGNOSIS — Z8659 Personal history of other mental and behavioral disorders: Secondary | ICD-10-CM

## 2018-04-30 DIAGNOSIS — Z124 Encounter for screening for malignant neoplasm of cervix: Secondary | ICD-10-CM

## 2018-04-30 DIAGNOSIS — Z98891 History of uterine scar from previous surgery: Secondary | ICD-10-CM

## 2018-04-30 MED ORDER — METOCLOPRAMIDE HCL 10 MG PO TABS: 10.0000 mg | ORAL_TABLET | Freq: Four times a day (QID) | ORAL | 0 refills | Status: DC

## 2018-04-30 NOTE — Progress Notes (Signed)
Laurie Sanders 17p paperwork completed.

## 2018-04-30 NOTE — Progress Notes (Signed)
Subjective:   Laurie Sanders is a 22 y.o. G2P0101 at 103w2d by LMP being seen today for her first obstetrical visit.  Her obstetrical history is significant for smoker, previous c/section, and previous preterm delivery. Patient does intend to breast feed. Pregnancy history fully reviewed. Her first pregnancy was at 22 years of age. She had no prenatal care & presented to ED for PPROM at 24 weeks. Delivered by classical c/section at [redacted]w[redacted]d. That child is now 3 years old and doing well per patient.  Smokes cigarettes. States she has decreased from 10 cigs/day to now 2/day & is continuing to decrease. Denies drug or alcohol use.   Endorses nausea and vomiting, specifically after eating meat. Does not have antiemetic at home.  Has not started prenatal vitamins b/c she wasn't sure which ones to take but is interested in starting.   HISTORY: OB History  Gravida Para Term Preterm AB Living  2 1 0 1 0 1  SAB TAB Ectopic Multiple Live Births  0 0 0 0 1    # Outcome Date GA Lbr Len/2nd Weight Sex Delivery Anes PTL Lv  2 Current           1 Preterm 05/09/11 [redacted]w[redacted]d   F CS-LTranv Spinal  LIV     Name: [facility]    Obstetric Comments  G1- per op note, classical incision   Past Medical History:  Diagnosis Date  . Asthma   . Preterm premature rupture of membranes in second trimester 04/29/2011   Past Surgical History:  Procedure Laterality Date  . CESAREAN SECTION  05/09/2011   Procedure: CESAREAN SECTION;  Surgeon: Catalina Antigua, MD;  Location: [facility];  Service: Gynecology;  Laterality: N/A;   Family History  Problem Relation Age of Onset  . Diabetes Mother   . Asthma Mother   . Asthma Father   . Hypertension Father    Social History   Tobacco Use  . Smoking status: Current Every Day Smoker    Packs/day: 0.50    Types: Cigarettes  . Smokeless tobacco: Never Used  Substance Use Topics  . Alcohol use: No  . Drug use: No   Allergies  Allergen Reactions  .  Benadryl [Diphenhydramine Hcl] Hives  . Kiwi Extract Swelling    Lips swell   Current Outpatient Medications on File Prior to Visit  Medication Sig Dispense Refill  . albuterol (PROVENTIL HFA;VENTOLIN HFA) 108 (90 BASE) MCG/ACT inhaler Inhale 2 puffs into the lungs every 4 (four) hours as needed for wheezing. (Patient not taking: Reported on 09/23/2017) 1 Inhaler 1  . EPINEPHrine (EPIPEN) 0.3 mg/0.3 mL IJ SOAJ injection Inject 0.3 mLs (0.3 mg total) into the muscle once. (Patient not taking: Reported on 04/30/2018) 1 Device 0  . FLUoxetine (PROZAC) 20 MG capsule Take 1 capsule (20 mg total) by mouth daily. (Patient not taking: Reported on 09/23/2017) 30 capsule 0  . metroNIDAZOLE (FLAGYL) 500 MG tablet Take 1 tablet (500 mg total) by mouth 2 (two) times daily. (Patient not taking: Reported on 09/23/2017) 14 tablet 0  . traZODone (DESYREL) 50 MG tablet Take 1 tablet (50 mg total) by mouth at bedtime as needed for sleep. (Patient not taking: Reported on 09/23/2017) 30 tablet 0   No current facility-administered medications on file prior to visit.     Exam   Vitals:   04/30/18 0842  BP: (!) 111/52  Pulse: 67  Weight: 169 lb 12.8 oz (77 kg)   Fetal Heart Rate (bpm):  158  Uterus:   Enlarged ~10-12 wks, Non tender.   Pelvic Exam: Perineum: no hemorrhoids, normal perineum   Vulva: normal external genitalia, no lesions   Vagina:  normal mucosa, normal discharge   Cervix: no lesions and normal, pap smear done.    Adnexa: normal adnexa and no mass, fullness, tenderness  System: General: well-developed, well-nourished female in no acute distress   Breast:  normal appearance, no masses or tenderness   Skin: normal coloration and turgor, no rashes   Neurologic: oriented, normal, negative, normal mood   Extremities: normal strength, tone, and muscle mass, ROM of all joints is normal   HEENT PERRLA, extraocular movement intact and sclera clear, anicteric   Mouth/Teeth mucous membranes moist, pharynx  normal without lesions and dental hygiene good   Neck supple and no masses   Cardiovascular: regular rate and rhythm   Respiratory:  no respiratory distress, normal breath sounds   Abdomen: soft, non-tender; bowel sounds normal; no masses,  no organomegaly     Assessment:   Pregnancy: [id number] Patient Active Problem List   Diagnosis Date Noted  . Hx of preterm delivery, currently pregnant, first trimester 04/30/2018  . Supervision of high risk pregnancy, antepartum 04/30/2018  . Intentional drug overdose (HCC)   . 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 Asthma with status asthmaticus 06/11/2012  . Status post cesarean delivery 06/16/2011     Plan:  1. Supervision of high risk pregnancy, antepartum  - Cytology - PAP - Cystic fibrosis gene test - Culture, OB Urine - Hemoglobinopathy Evaluation - Obstetric Panel, Including HIV - SMN1 COPY NUMBER ANALYSIS (SMA Carrier Screen)  2. Nausea and vomiting during pregnancy prior to [redacted] weeks gestation  - metoCLOPramide (REGLAN) 10 MG tablet; Take 1 tablet (10 mg total) by mouth every 6 (six) hours.  Dispense: 30 tablet; Refill: 0  3. Hx of preterm delivery, currently pregnant, first trimester -agreeable to start 17-P at 16 wks. Paperwork started  4. Status post cesarean delivery -previous classical c/section per op note. Discussed will need repeat c/section    Initial labs drawn. Start prenatal vitamins. Genetic Screening discussed, First trimester screen, Quad screen and NIPS: pt unsure. Will revisit at next appt. Marland Kitchen Ultrasound discussed; fetal anatomic survey: discussed, will schedule at next visit. Problem list reviewed and updated. The nature of [facility] with multiple MDs and other Advanced Practice Providers was explained to patient; also emphasized that residents, students are part of our team. Routine obstetric precautions  reviewed. Return in about 4 weeks (around 05/28/2018) for High Risk OB.   Judeth Horn 9:23 AM 04/30/18

## 2018-04-30 NOTE — Patient Instructions (Signed)
First Trimester of Pregnancy The first trimester of pregnancy is from week 1 until the end of week 13 (months 1 through 3). During this time, your baby will begin to develop inside you. At 6-8 weeks, the eyes and face are formed, and the heartbeat can be seen on ultrasound. At the end of 12 weeks, all the baby's organs are formed. Prenatal care is all the medical care you receive before the birth of your baby. Make sure you get good prenatal care and follow all of your doctor's instructions. Follow these instructions at home: Medicines  Take over-the-counter and prescription medicines only as told by your doctor. Some medicines are safe and some medicines are not safe during pregnancy.  Take a prenatal vitamin that contains at least 600 micrograms (mcg) of folic acid.  If you have trouble pooping (constipation), take medicine that will make your stool soft (stool softener) if your doctor approves. Eating and drinking  Eat regular, healthy meals.  Your doctor will tell you the amount of weight gain that is right for you.  Avoid raw meat and uncooked cheese.  If you feel sick to your stomach (nauseous) or throw up (vomit): ? Eat 4 or 5 small meals a day instead of 3 large meals. ? Try eating a few soda crackers. ? Drink liquids between meals instead of during meals.  To prevent constipation: ? Eat foods that are high in fiber, like fresh fruits and vegetables, whole grains, and beans. ? Drink enough fluids to keep your pee (urine) clear or pale yellow. Activity  Exercise only as told by your doctor. Stop exercising if you have cramps or pain in your lower belly (abdomen) or low back.  Do not exercise if it is too hot, too humid, or if you are in a place of great height (high altitude).  Try to avoid standing for long periods of time. Move your legs often if you must stand in one place for a long time.  Avoid heavy lifting.  Wear low-heeled shoes. Sit and stand up straight.  You  can have sex unless your doctor tells you not to. Relieving pain and discomfort  Wear a good support bra if your breasts are sore.  Take warm water baths (sitz baths) to soothe pain or discomfort caused by hemorrhoids. Use hemorrhoid cream if your doctor says it is okay.  Rest with your legs raised if you have leg cramps or low back pain.  If you have puffy, bulging veins (varicose veins) in your legs: ? Wear support hose or compression stockings as told by your doctor. ? Raise (elevate) your feet for 15 minutes, 3-4 times a day. ? Limit salt in your food. Prenatal care  Schedule your prenatal visits by the twelfth week of pregnancy.  Write down your questions. Take them to your prenatal visits.  Keep all your prenatal visits as told by your doctor. This is important. Safety  Wear your seat belt at all times when driving.  Make a list of emergency phone numbers. The list should include numbers for family, friends, the hospital, and police and fire departments. General instructions  Ask your doctor for a referral to a local prenatal class. Begin classes no later than at the start of month 6 of your pregnancy.  Ask for help if you need counseling or if you need help with nutrition. Your doctor can give you advice or tell you where to go for help.  Do not use hot tubs, steam rooms, or   saunas.  Do not douche or use tampons or scented sanitary pads.  Do not cross your legs for long periods of time.  Avoid all herbs and alcohol. Avoid drugs that are not approved by your doctor.  Do not use any tobacco products, including cigarettes, chewing tobacco, and electronic cigarettes. If you need help quitting, ask your doctor. You may get counseling or other support to help you quit.  Avoid cat litter boxes and soil used by cats. These carry germs that can cause birth defects in the baby and can cause a loss of your baby (miscarriage) or stillbirth.  Visit your dentist. At home, brush  your teeth with a soft toothbrush. Be gentle when you floss. Contact a doctor if:  You are dizzy.  You have mild cramps or pressure in your lower belly.  You have a nagging pain in your belly area.  You continue to feel sick to your stomach, you throw up, or you have watery poop (diarrhea).  You have a bad smelling fluid coming from your vagina.  You have pain when you pee (urinate).  You have increased puffiness (swelling) in your face, hands, legs, or ankles. Get help right away if:  You have a fever.  You are leaking fluid from your vagina.  You have spotting or bleeding from your vagina.  You have very bad belly cramping or pain.  You gain or lose weight rapidly.  You throw up blood. It may look like coffee grounds.  You are around people who have German measles, fifth disease, or chickenpox.  You have a very bad headache.  You have shortness of breath.  You have any kind of trauma, such as from a fall or a car accident. Summary  The first trimester of pregnancy is from week 1 until the end of week 13 (months 1 through 3).  To take care of yourself and your unborn baby, you will need to eat healthy meals, take medicines only if your doctor tells you to do so, and do activities that are safe for you and your baby.  Keep all follow-up visits as told by your doctor. This is important as your doctor will have to ensure that your baby is healthy and growing well. This information is not intended to replace advice given to you by your health care provider. Make sure you discuss any questions you have with your health care provider. Document Released: 12/31/2007 Document Revised: 07/22/2016 Document Reviewed: 07/22/2016 Elsevier Interactive Patient Education  2017 [employer]  

## 2018-04-30 NOTE — BH Specialist Note (Signed)
Integrated Behavioral Health Initial Visit  MRN: [id number] Name: Laurie Sanders  Number of Integrated Behavioral Health Clinician visits:: 1/6 Session Start time: 9:49 Session End time: 9:58 Total time: 15 minutes  Type of Service: Integrated Behavioral Health- Individual/Family Interpretor:No. Interpretor Name and Language: n/a   Warm Hand Off Completed.       SUBJECTIVE: Delorese Sanders is a 22 y.o. female accompanied by n/a Patient was referred by Judeth Horn, NP for Initial OB introduction to integrated behavioral health services . Patient reports the following symptoms/concerns: Pt states she has a history of depression, including [facility] hospitalization, is not currently being treated. Pt denies current depressive episode; attributes current primary symptoms of fatigue and  lack of appetite to nausea with morning sickness.  Duration of problem: Current pregnancy; Severity of problem: moderate  OBJECTIVE: Mood: Normal and Affect: Appropriate Risk of harm to self or others: No plan to harm self or others  LIFE CONTEXT: Family and Social: Pt has 7yo daughter School/Work: - Self-Care: - Life Changes: Current pregnancy  GOALS ADDRESSED: Patient will: 1. Increase knowledge and/or ability of: healthy habits   INTERVENTIONS: Interventions utilized: Supportive Counseling  Standardized Assessments completed: GAD-7 and PHQ 9  ASSESSMENT: Patient currently experiencing Supervision of .   Patient may benefit from Initial OB introduction to integrated behavioral health services .  PLAN: 1. Follow up with behavioral health clinician on : One month 2. Behavioral recommendations:  -Begin taking prenatal vitamin today, as recommended by medical provider 3. Referral(s): Integrated [employer] (In Clinic) 4. "From scale of 1-10, how likely are you to follow plan?": 10  Rae Lips, LCSW  Depression screen [facility] 2/9 04/30/2018  Decreased Interest 2   Down, Depressed, Hopeless 1  PHQ - 2 Score 3  Altered sleeping 2  Tired, decreased energy 3  Change in appetite 3  Feeling bad or failure about yourself  0  Trouble concentrating 1  Moving slowly or fidgety/restless 2  Suicidal thoughts 0  PHQ-9 Score 14   GAD 7 : Generalized Anxiety Score 04/30/2018  Nervous, Anxious, on Edge 1  Control/stop worrying 2  Worry too much - different things 1  Trouble relaxing 1  Restless 1  Easily annoyed or irritable 2  Afraid - awful might happen 1  Total GAD 7 Score 9

## 2018-05-02 LAB — CULTURE, OB URINE

## 2018-05-02 LAB — URINE CULTURE, OB REFLEX

## 2018-05-03 ENCOUNTER — Encounter: Payer: Self-pay | Admitting: *Deleted

## 2018-05-03 LAB — CYTOLOGY - PAP
Adequacy: ABSENT
CHLAMYDIA, DNA PROBE: NEGATIVE
Diagnosis: NEGATIVE
Neisseria Gonorrhea: POSITIVE — AB
Trichomonas: NEGATIVE

## 2018-05-04 ENCOUNTER — Telehealth: Payer: Self-pay | Admitting: *Deleted

## 2018-05-04 NOTE — Telephone Encounter (Signed)
-----   Message from Judeth Horn, NP sent at 05/03/2018  4:56 PM EDT ----- Pt needs to be notified & brought in for gonorrhea treatment

## 2018-05-04 NOTE — Telephone Encounter (Signed)
Called pt to inform her of positive gonnorrhea.  Instructed her that she would need to be seen for treatment.  Pt verbalized understanding and reports she will come in to the walk-in clinic today 10/8 for treatment.  STD card filled out

## 2018-05-06 ENCOUNTER — Encounter: Payer: Self-pay | Admitting: *Deleted

## 2018-05-06 ENCOUNTER — Encounter: Payer: Self-pay | Admitting: Student

## 2018-05-06 DIAGNOSIS — O98211 Gonorrhea complicating pregnancy, first trimester: Secondary | ICD-10-CM | POA: Insufficient documentation

## 2018-05-10 LAB — OBSTETRIC PANEL, INCLUDING HIV
Antibody Screen: NEGATIVE
BASOS ABS: 0.1 10*3/uL (ref 0.0–0.2)
Basos: 1 %
EOS (ABSOLUTE): 0.2 10*3/uL (ref 0.0–0.4)
EOS: 2 %
HEP B S AG: NEGATIVE
HIV SCREEN 4TH GENERATION: NONREACTIVE
Hematocrit: 33.5 % — ABNORMAL LOW (ref 34.0–46.6)
Hemoglobin: 11.8 g/dL (ref 11.1–15.9)
Immature Grans (Abs): 0 10*3/uL (ref 0.0–0.1)
Immature Granulocytes: 0 %
Lymphocytes Absolute: 2.2 10*3/uL (ref 0.7–3.1)
Lymphs: 23 %
MCH: 32.4 pg (ref 26.6–33.0)
MCHC: 35.2 g/dL (ref 31.5–35.7)
MCV: 92 fL (ref 79–97)
MONOS ABS: 0.5 10*3/uL (ref 0.1–0.9)
Monocytes: 6 %
NEUTROS ABS: 6.3 10*3/uL (ref 1.4–7.0)
Neutrophils: 68 %
PLATELETS: 325 10*3/uL (ref 150–450)
RBC: 3.64 x10E6/uL — AB (ref 3.77–5.28)
RDW: 12.1 % — ABNORMAL LOW (ref 12.3–15.4)
RH TYPE: POSITIVE
RPR: NONREACTIVE
Rubella Antibodies, IGG: 3.06 index (ref >0.99)
WBC: 9.3 10*3/uL (ref 3.4–10.8)

## 2018-05-10 LAB — SMN1 COPY NUMBER ANALYSIS (SMA CARRIER SCREENING)

## 2018-05-10 LAB — HEMOGLOBINOPATHY EVALUATION
Ferritin: 88 ng/mL (ref 15–150)
HGB A2 QUANT: 2.7 % (ref 1.8–3.2)
HGB VARIANT: 0 %
Hgb A: 96.4 % (ref 96.4–98.8)
Hgb C: 0 %
Hgb F Quant: 0.9 % (ref 0.0–2.0)
Hgb S: 0 %
Hgb Solubility: NEGATIVE

## 2018-05-10 LAB — CYSTIC FIBROSIS GENE TEST

## 2018-05-18 ENCOUNTER — Telehealth: Payer: Self-pay | Admitting: *Deleted

## 2018-05-18 NOTE — Telephone Encounter (Signed)
Attempted to call case manager from [facility] back but she did not pick up.  Left message requesting that she call back but informing her that I did not see that we have family planning medicaid listed for this patient.

## 2018-05-18 NOTE — Telephone Encounter (Signed)
[facility] called requesting that we provide the patient's Medicaid ID.  They stated that "they know she has family planning medicaid instead of pregnancy medicaid but they still need the ID number."  Call back number is [phone] ext [phone].

## 2018-05-21 ENCOUNTER — Telehealth: Payer: Self-pay | Admitting: General Practice

## 2018-05-21 NOTE — Telephone Encounter (Signed)
Deirlem, care manager for [facility] called stating they were informed patient had family planing medicaid which would be switched to regular medicaid but no ID number was given. Per review, patient does not have medicaid at all currently. Called [employer], no answer- left message stating patient currently doesn't have any insurance and will need assistance through assistance program for free medication.

## 2018-05-27 ENCOUNTER — Telehealth: Payer: Self-pay | Admitting: *Deleted

## 2018-05-27 ENCOUNTER — Encounter: Payer: Self-pay | Admitting: Obstetrics and Gynecology

## 2018-05-27 ENCOUNTER — Ambulatory Visit (INDEPENDENT_AMBULATORY_CARE_PROVIDER_SITE_OTHER): Payer: Self-pay | Admitting: Obstetrics and Gynecology

## 2018-05-27 VITALS — BP 102/61 | HR 96 | Wt 174.7 lb

## 2018-05-27 DIAGNOSIS — O0990 Supervision of high risk pregnancy, unspecified, unspecified trimester: Secondary | ICD-10-CM

## 2018-05-27 DIAGNOSIS — O09891 Supervision of other high risk pregnancies, first trimester: Secondary | ICD-10-CM

## 2018-05-27 DIAGNOSIS — O09211 Supervision of pregnancy with history of pre-term labor, first trimester: Secondary | ICD-10-CM

## 2018-05-27 DIAGNOSIS — O98211 Gonorrhea complicating pregnancy, first trimester: Secondary | ICD-10-CM

## 2018-05-27 DIAGNOSIS — O219 Vomiting of pregnancy, unspecified: Secondary | ICD-10-CM

## 2018-05-27 DIAGNOSIS — F332 Major depressive disorder, recurrent severe without psychotic features: Secondary | ICD-10-CM

## 2018-05-27 DIAGNOSIS — Z98891 History of uterine scar from previous surgery: Secondary | ICD-10-CM | POA: Insufficient documentation

## 2018-05-27 MED ORDER — AZITHROMYCIN 250 MG PO TABS
1000.0000 mg | ORAL_TABLET | Freq: Once | ORAL | Status: AC
  Administered 2018-05-27: 1000 mg via ORAL

## 2018-05-27 MED ORDER — METOCLOPRAMIDE HCL 10 MG PO TABS: 10.0000 mg | ORAL_TABLET | Freq: Four times a day (QID) | ORAL | 0 refills | Status: DC

## 2018-05-27 MED ORDER — CEFTRIAXONE SODIUM 250 MG IJ SOLR
250.0000 mg | Freq: Once | INTRAMUSCULAR | Status: AC
  Administered 2018-05-27: 250 mg via INTRAMUSCULAR

## 2018-05-27 NOTE — Progress Notes (Signed)
Subjective:  Laurie Sanders is a 22 y.o. G2P0101 at [redacted]w[redacted]d being seen today for ongoing prenatal care.  She is currently monitored for the following issues for this high-risk pregnancy and has Asthma with status asthmaticus; Major depressive disorder, recurrent severe without psychotic features (HCC); Intentional drug overdose (HCC); Hx of preterm delivery, currently pregnant, first trimester; Supervision of high risk pregnancy, antepartum; Gonorrhea in pregnancy, antepartum, first trimester; and History of cesarean section, classical on their problem list.  Patient reports no complaints.  Contractions: Not present. Vag. Bleeding: None.  Movement: Absent. Denies leaking of fluid.   The following portions of the patient's history were reviewed and updated as appropriate: allergies, current medications, past family history, past medical history, past social history, past surgical history and problem list. Problem list updated.  Objective:   Vitals:   05/27/18 0937  BP: 102/61  Pulse: 96  Weight: 174 lb 11.2 oz (79.2 kg)    Fetal Status: Fetal Heart Rate (bpm): 142   Movement: Absent     General:  Alert, oriented and cooperative. Patient is in no acute distress.  Skin: Skin is warm and dry. No rash noted.   Cardiovascular: Normal heart rate noted  Respiratory: Normal respiratory effort, no problems with respiration noted  Abdomen: Soft, gravid, appropriate for gestational age. Pain/Pressure: Absent     Pelvic:  Cervical exam deferred        Extremities: Normal range of motion.  Edema: None  Mental Status: Normal mood and affect. Normal behavior. Normal judgment and thought content.   Urinalysis:      Assessment and Plan:  Pregnancy: G2P0101 at [redacted]w[redacted]d  1. Supervision of high risk pregnancy, antepartum Stable Decline flu vaccine Genetic testing reviewed with pt Desires, Panorama ordered - metoCLOPramide (REGLAN) 10 MG tablet; Take 1 tablet (10 mg total) by mouth every 6 (six) hours.   Dispense: 30 tablet; Refill: 0 - [place] [facility] OB COMP + 14 WK; Future  2. Gonorrhea in pregnancy, antepartum, first trimester Tx today Advised to have partner treated, reframe from IC until TOC  3. Hx of preterm delivery, currently pregnant, first trimester 45 OHP paperwork has been completed  4. Major depressive disorder, recurrent severe without psychotic features (HCC) Stable, off meds. Declined to see Asher Muir today  5. History of cesarean section, classical Will need repeat   6. Nausea and vomiting during pregnancy prior to [redacted] weeks gestation Reglan Rx sent Improving. + Wt gain - metoCLOPramide (REGLAN) 10 MG tablet; Take 1 tablet (10 mg total) by mouth every 6 (six) hours.  Dispense: 30 tablet; Refill: 0  Preterm labor symptoms and general obstetric precautions including but not limited to vaginal bleeding, contractions, leaking of fluid and fetal movement were reviewed in detail with the patient. Please refer to After Visit Summary for other counseling recommendations.  Return in about 4 weeks (around 06/24/2018) for OB visit.   Hermina Staggers, MD

## 2018-05-27 NOTE — Patient Instructions (Signed)

## 2018-05-27 NOTE — Addendum Note (Signed)
Addended by: Kathee Delton on: 05/27/2018 10:19 AM   Modules accepted: Orders

## 2018-05-27 NOTE — Progress Notes (Signed)
Called Deirlem with [employer] who states they have been trying to reach the patient for screening for patient assistance program (free [place]) but patient hasn't answered their calls. Patient spoke with Deirlem while in office and her application was processed. The speciality pharmacy will call us next week to set up shipment.  Anatomy u/s scheduled 12/4 @ 1015

## 2018-05-27 NOTE — Telephone Encounter (Signed)
Received a voicemail from [employer] stating she has been approved for [employer] and they need to verify some information before they can ship her makena. I called [employer] and gave requested information. We should receive her first shipment on 11/12.19.

## 2018-06-08 ENCOUNTER — Telehealth: Payer: Self-pay | Admitting: Family Medicine

## 2018-06-08 ENCOUNTER — Telehealth: Payer: Self-pay

## 2018-06-08 NOTE — Progress Notes (Unsigned)
Pt Laurie Sanders arrived 06/08/18, pt needs Nurse visit for 1 st dose.

## 2018-06-08 NOTE — Telephone Encounter (Signed)
LVM to get scheduled for makena injection, left office number to call back to get scheduled.

## 2018-06-10 NOTE — Telephone Encounter (Signed)
Patient called and left a message , states that we called her and she is not sure what it was about. States can call her about. Per chart see that registrar called her about appointment. Wil route to Passenger transport managerregistrar.

## 2018-06-14 ENCOUNTER — Ambulatory Visit: Payer: Self-pay

## 2018-06-14 ENCOUNTER — Encounter: Payer: Self-pay | Admitting: *Deleted

## 2018-06-17 NOTE — Telephone Encounter (Signed)
Opened in error

## 2018-06-21 ENCOUNTER — Ambulatory Visit (INDEPENDENT_AMBULATORY_CARE_PROVIDER_SITE_OTHER): Payer: Medicaid Other | Admitting: *Deleted

## 2018-06-21 ENCOUNTER — Encounter: Payer: Self-pay | Admitting: *Deleted

## 2018-06-21 VITALS — BP 102/45 | HR 83 | Wt 184.0 lb

## 2018-06-21 DIAGNOSIS — O09212 Supervision of pregnancy with history of pre-term labor, second trimester: Secondary | ICD-10-CM | POA: Diagnosis not present

## 2018-06-21 DIAGNOSIS — O09891 Supervision of other high risk pregnancies, first trimester: Secondary | ICD-10-CM

## 2018-06-21 DIAGNOSIS — O09211 Supervision of pregnancy with history of pre-term labor, first trimester: Principal | ICD-10-CM

## 2018-06-21 MED ORDER — HYDROXYPROGESTERONE CAPROATE 275 MG/1.1ML ~~LOC~~ SOAJ
275.0000 mg | Freq: Once | SUBCUTANEOUS | Status: AC
  Administered 2018-06-21: 275 mg via SUBCUTANEOUS

## 2018-06-21 NOTE — Progress Notes (Signed)
Pt here for P-17 injection. Explained reason for receiving the shots and pt agrees with POC. Understands she will receive these wkly thru 36wks..Denies any complaints. FHR 142. Makena given and pt tol well.

## 2018-06-21 NOTE — Progress Notes (Signed)
Chart reviewed for nurse visit. Agree with plan of care.   Marylene LandKooistra, Kathryn Lorraine, CNM 06/21/2018 8:13 PM

## 2018-06-23 ENCOUNTER — Encounter (HOSPITAL_COMMUNITY): Payer: Self-pay

## 2018-06-30 ENCOUNTER — Other Ambulatory Visit (HOSPITAL_COMMUNITY)
Admission: RE | Admit: 2018-06-30 | Discharge: 2018-06-30 | Disposition: A | Discharge status: Home / Self Care | Payer: Medicaid Other | Source: Ambulatory Visit | Attending: Obstetrics & Gynecology | Admitting: Obstetrics & Gynecology

## 2018-06-30 ENCOUNTER — Other Ambulatory Visit: Payer: Self-pay | Admitting: Obstetrics and Gynecology

## 2018-06-30 ENCOUNTER — Ambulatory Visit (HOSPITAL_COMMUNITY)
Admission: RE | Admit: 2018-06-30 | Discharge: 2018-06-30 | Disposition: A | Discharge status: Home / Self Care | Payer: Medicaid Other | Source: Ambulatory Visit | Attending: Obstetrics and Gynecology | Admitting: Obstetrics and Gynecology

## 2018-06-30 ENCOUNTER — Ambulatory Visit (INDEPENDENT_AMBULATORY_CARE_PROVIDER_SITE_OTHER): Payer: Medicaid Other | Admitting: Obstetrics & Gynecology

## 2018-06-30 VITALS — BP 122/65 | HR 91 | Wt 193.4 lb

## 2018-06-30 DIAGNOSIS — O09292 Supervision of pregnancy with other poor reproductive or obstetric history, second trimester: Secondary | ICD-10-CM | POA: Diagnosis present

## 2018-06-30 DIAGNOSIS — O0992 Supervision of high risk pregnancy, unspecified, second trimester: Secondary | ICD-10-CM

## 2018-06-30 DIAGNOSIS — Z98891 History of uterine scar from previous surgery: Secondary | ICD-10-CM

## 2018-06-30 DIAGNOSIS — Z363 Encounter for antenatal screening for malformations: Secondary | ICD-10-CM | POA: Diagnosis not present

## 2018-06-30 DIAGNOSIS — O0991 Supervision of high risk pregnancy, unspecified, first trimester: Secondary | ICD-10-CM | POA: Diagnosis not present

## 2018-06-30 DIAGNOSIS — Z113 Encounter for screening for infections with a predominantly sexual mode of transmission: Secondary | ICD-10-CM | POA: Diagnosis present

## 2018-06-30 DIAGNOSIS — O98211 Gonorrhea complicating pregnancy, first trimester: Secondary | ICD-10-CM | POA: Diagnosis present

## 2018-06-30 DIAGNOSIS — O0990 Supervision of high risk pregnancy, unspecified, unspecified trimester: Secondary | ICD-10-CM

## 2018-06-30 DIAGNOSIS — O09891 Supervision of other high risk pregnancies, first trimester: Secondary | ICD-10-CM

## 2018-06-30 DIAGNOSIS — Z3A19 19 weeks gestation of pregnancy: Secondary | ICD-10-CM

## 2018-06-30 DIAGNOSIS — O34219 Maternal care for unspecified type scar from previous cesarean delivery: Secondary | ICD-10-CM | POA: Diagnosis not present

## 2018-06-30 DIAGNOSIS — O09212 Supervision of pregnancy with history of pre-term labor, second trimester: Secondary | ICD-10-CM

## 2018-06-30 DIAGNOSIS — O09211 Supervision of pregnancy with history of pre-term labor, first trimester: Principal | ICD-10-CM

## 2018-06-30 DIAGNOSIS — O98212 Gonorrhea complicating pregnancy, second trimester: Secondary | ICD-10-CM

## 2018-06-30 MED ORDER — METRONIDAZOLE 500 MG PO TABS: 500.0000 mg | ORAL_TABLET | Freq: Two times a day (BID) | ORAL | 0 refills | Status: DC

## 2018-06-30 MED ORDER — HYDROXYPROGESTERONE CAPROATE 275 MG/1.1ML ~~LOC~~ SOAJ
275.0000 mg | Freq: Once | SUBCUTANEOUS | Status: AC
  Administered 2018-06-30: 275 mg via SUBCUTANEOUS

## 2018-06-30 NOTE — Progress Notes (Signed)
   PRENATAL VISIT NOTE  Subjective:  Laurie Sanders is a 22 y.o. G2P0101 at 4853w0d being seen today for ongoing prenatal care.  She is currently monitored for the following issues for this high-risk pregnancy and has Asthma with status asthmaticus; Major depressive disorder, recurrent severe without psychotic features (HCC); Intentional drug overdose (HCC); Hx of preterm delivery, currently pregnant, first trimester; Supervision of high risk pregnancy, antepartum; Gonorrhea in pregnancy, antepartum, first trimester; and History of cesarean section, classical on their problem list.  Patient reports no complaints.  Contractions: Not present. Vag. Bleeding: None.  Movement: Absent. Denies leaking of fluid.   The following portions of the patient's history were reviewed and updated as appropriate: allergies, current medications, past family history, past medical history, past social history, past surgical history and problem list. Problem list updated.  Objective:   Vitals:   06/30/18 1013  BP: 122/65  Pulse: 91  Weight: 193 lb 6.4 oz (87.7 kg)    Fetal Status: Fetal Heart Rate (bpm): 151   Movement: Absent     General:  Alert, oriented and cooperative. Patient is in no acute distress.  Skin: Skin is warm and dry. No rash noted.   Cardiovascular: Normal heart rate noted  Respiratory: Normal respiratory effort, no problems with respiration noted  Abdomen: Soft, gravid, appropriate for gestational age.  Pain/Pressure: Absent     Pelvic: Cervical exam deferred        Extremities: Normal range of motion.  Edema: None  Mental Status: Normal mood and affect. Normal behavior. Normal judgment and thought content.   Assessment and Plan:  Pregnancy: G2P0101 at 353w0d  1. Hx of preterm delivery, currently pregnant, first trimester  - HYDROXYprogesterone Caproate SOAJ 275 mg  2. Supervision of high risk pregnancy, antepartum - anatomy u/s - MSAFP today 3. History of cesarean section,  classical   4. Gonorrhea in pregnancy, antepartum, first trimester  - Cervicovaginal ancillary only( [place]) 5. Probable BV- treat with flagyl  Preterm labor symptoms and general obstetric precautions including but not limited to vaginal bleeding, contractions, leaking of fluid and fetal movement were reviewed in detail with the patient. Please refer to After Visit Summary for other counseling recommendations.  Return in about 4 weeks (around 07/28/2018).  No future appointments.  Laurie BossierMyra C Macallister Ashmead, MD

## 2018-07-01 LAB — CERVICOVAGINAL ANCILLARY ONLY
Chlamydia: NEGATIVE
Neisseria Gonorrhea: NEGATIVE

## 2018-07-02 ENCOUNTER — Other Ambulatory Visit (HOSPITAL_COMMUNITY): Payer: Self-pay | Admitting: *Deleted

## 2018-07-02 DIAGNOSIS — Z8759 Personal history of other complications of pregnancy, childbirth and the puerperium: Secondary | ICD-10-CM

## 2018-07-02 LAB — AFP, SERUM, OPEN SPINA BIFIDA
AFP MoM: 0.83
AFP Value: 38.6 ng/mL
Gest. Age on Collection Date: 19 weeks
Maternal Age At EDD: 21.7 yr
OSBR Risk 1 IN: 10000
Test Results:: NEGATIVE
Weight: 193 [lb_av]

## 2018-07-12 ENCOUNTER — Telehealth: Payer: Self-pay | Admitting: *Deleted

## 2018-07-12 NOTE — Telephone Encounter (Signed)
Per review of chart see that she does not have any appt for 17p. Called her and asked her to come in asap for weekly 17p she agreed to tomorrow. Sent message to registrars to schedule next ob.

## 2018-07-14 ENCOUNTER — Ambulatory Visit (HOSPITAL_COMMUNITY)
Admission: RE | Admit: 2018-07-14 | Discharge: 2018-07-14 | Disposition: A | Discharge status: Home / Self Care | Payer: Medicaid Other | Source: Ambulatory Visit | Attending: Obstetrics and Gynecology | Admitting: Obstetrics and Gynecology

## 2018-07-14 ENCOUNTER — Encounter (HOSPITAL_COMMUNITY): Payer: Self-pay

## 2018-07-14 DIAGNOSIS — O09292 Supervision of pregnancy with other poor reproductive or obstetric history, second trimester: Secondary | ICD-10-CM | POA: Diagnosis present

## 2018-07-14 DIAGNOSIS — Z8759 Personal history of other complications of pregnancy, childbirth and the puerperium: Secondary | ICD-10-CM

## 2018-07-14 DIAGNOSIS — O34219 Maternal care for unspecified type scar from previous cesarean delivery: Secondary | ICD-10-CM | POA: Insufficient documentation

## 2018-07-14 DIAGNOSIS — Z3A21 21 weeks gestation of pregnancy: Secondary | ICD-10-CM | POA: Insufficient documentation

## 2018-07-14 DIAGNOSIS — Z363 Encounter for antenatal screening for malformations: Secondary | ICD-10-CM | POA: Diagnosis not present

## 2018-07-19 ENCOUNTER — Encounter: Payer: Self-pay | Admitting: Obstetrics and Gynecology

## 2018-07-19 ENCOUNTER — Telehealth: Payer: Self-pay | Admitting: Obstetrics and Gynecology

## 2018-07-19 NOTE — Telephone Encounter (Signed)
LVM for pt to give the office a call to get rescheduled for her missed HOB appt. Letter mailed.

## 2018-07-20 NOTE — Progress Notes (Signed)
Patient did not keep her OB appointment for 07/19/2018.  Cornelia Copaharlie Ariann Khaimov, Jr MD Attending Center for [facility](Faculty Practice)

## 2018-07-29 ENCOUNTER — Ambulatory Visit (HOSPITAL_COMMUNITY): Admission: RE | Admit: 2018-07-29 | Payer: Self-pay | Source: Ambulatory Visit

## 2018-08-03 ENCOUNTER — Encounter: Payer: Self-pay | Admitting: *Deleted

## 2018-08-03 ENCOUNTER — Telehealth: Payer: Self-pay | Admitting: *Deleted

## 2018-08-03 NOTE — Telephone Encounter (Signed)
Received shipment of makena for Laurie Sanders. Do not see that she has appointment scheduled and that she DNKA last ob appt. Called both her mobile and home numbers and left message I am calling to let her know we need to schedule her appointments for her next injection and ob visit. Please call our office. Does not have MyChart- will send letter.

## 2018-08-04 ENCOUNTER — Ambulatory Visit (INDEPENDENT_AMBULATORY_CARE_PROVIDER_SITE_OTHER): Payer: Medicaid Other | Admitting: General Practice

## 2018-08-04 ENCOUNTER — Ambulatory Visit (HOSPITAL_COMMUNITY)
Admission: RE | Admit: 2018-08-04 | Discharge: 2018-08-04 | Disposition: A | Discharge status: Home / Self Care | Payer: Medicaid Other | Source: Ambulatory Visit | Attending: Obstetrics and Gynecology | Admitting: Obstetrics and Gynecology

## 2018-08-04 ENCOUNTER — Encounter (HOSPITAL_COMMUNITY): Payer: Self-pay

## 2018-08-04 VITALS — BP 115/64 | HR 87 | Ht 64.0 in | Wt 212.0 lb

## 2018-08-04 DIAGNOSIS — O09292 Supervision of pregnancy with other poor reproductive or obstetric history, second trimester: Secondary | ICD-10-CM | POA: Diagnosis not present

## 2018-08-04 DIAGNOSIS — O09891 Supervision of other high risk pregnancies, first trimester: Secondary | ICD-10-CM

## 2018-08-04 DIAGNOSIS — Z8759 Personal history of other complications of pregnancy, childbirth and the puerperium: Secondary | ICD-10-CM

## 2018-08-04 DIAGNOSIS — O09212 Supervision of pregnancy with history of pre-term labor, second trimester: Secondary | ICD-10-CM

## 2018-08-04 DIAGNOSIS — Z3A24 24 weeks gestation of pregnancy: Secondary | ICD-10-CM | POA: Diagnosis not present

## 2018-08-04 DIAGNOSIS — O09211 Supervision of pregnancy with history of pre-term labor, first trimester: Principal | ICD-10-CM

## 2018-08-04 DIAGNOSIS — O34219 Maternal care for unspecified type scar from previous cesarean delivery: Secondary | ICD-10-CM | POA: Diagnosis not present

## 2018-08-04 MED ORDER — HYDROXYPROGESTERONE CAPROATE 275 MG/1.1ML ~~LOC~~ SOAJ
275.0000 mg | Freq: Once | SUBCUTANEOUS | Status: AC
  Administered 2018-08-04: 275 mg via SUBCUTANEOUS

## 2018-08-04 NOTE — Progress Notes (Signed)
I have reviewed this chart and agree with the RN/CMA assessment and management.    K. Meryl Davis, M.D. Attending [facility] (Faculty Practice)   

## 2018-08-04 NOTE — Progress Notes (Signed)
Laurie Sanders here for 17-P  Injection.  Injection administered without complication. Patient will return in one week for next injection.  Marylynn Pearson, RN 08/04/2018  10:53 AM

## 2018-08-13 ENCOUNTER — Encounter: Payer: Self-pay | Admitting: Advanced Practice Midwife

## 2018-08-13 ENCOUNTER — Telehealth: Payer: Self-pay | Admitting: Advanced Practice Midwife

## 2018-08-13 NOTE — Telephone Encounter (Signed)
Left a voicemail message to call our clinic to reschedule.

## 2018-09-09 ENCOUNTER — Telehealth: Payer: Self-pay | Admitting: *Deleted

## 2018-09-09 ENCOUNTER — Ambulatory Visit: Payer: Self-pay

## 2018-09-09 NOTE — Telephone Encounter (Signed)
Noted patient has multiple vials of Makena in cabinet and has missed appointments. I called her mobile number and unable to leave message as voicemail was full. I called her home number and she answered. I informed her she has missed several appointments and we need to get her scheduled for her weekly injections. She states she can come today at 3:30 for injection.

## 2018-09-17 ENCOUNTER — Encounter: Payer: Self-pay | Admitting: Obstetrics and Gynecology

## 2018-09-21 ENCOUNTER — Ambulatory Visit: Payer: Self-pay

## 2018-09-30 ENCOUNTER — Other Ambulatory Visit: Payer: Self-pay

## 2018-09-30 ENCOUNTER — Telehealth: Payer: Self-pay | Admitting: Obstetrics and Gynecology

## 2018-09-30 ENCOUNTER — Ambulatory Visit (INDEPENDENT_AMBULATORY_CARE_PROVIDER_SITE_OTHER): Payer: Medicaid Other | Admitting: Nurse Practitioner

## 2018-09-30 ENCOUNTER — Encounter: Payer: Self-pay | Admitting: Nurse Practitioner

## 2018-09-30 VITALS — BP 104/69 | HR 99 | Wt 227.0 lb

## 2018-09-30 DIAGNOSIS — O09213 Supervision of pregnancy with history of pre-term labor, third trimester: Secondary | ICD-10-CM

## 2018-09-30 DIAGNOSIS — O0990 Supervision of high risk pregnancy, unspecified, unspecified trimester: Secondary | ICD-10-CM

## 2018-09-30 DIAGNOSIS — O0993 Supervision of high risk pregnancy, unspecified, third trimester: Secondary | ICD-10-CM

## 2018-09-30 DIAGNOSIS — O09891 Supervision of other high risk pregnancies, first trimester: Secondary | ICD-10-CM

## 2018-09-30 DIAGNOSIS — Z23 Encounter for immunization: Secondary | ICD-10-CM

## 2018-09-30 DIAGNOSIS — Z3A32 32 weeks gestation of pregnancy: Secondary | ICD-10-CM

## 2018-09-30 DIAGNOSIS — Z98891 History of uterine scar from previous surgery: Secondary | ICD-10-CM

## 2018-09-30 DIAGNOSIS — O09211 Supervision of pregnancy with history of pre-term labor, first trimester: Secondary | ICD-10-CM

## 2018-09-30 MED ORDER — VITAFOL GUMMIES 3.33-0.333-34.8 MG PO CHEW: 3.0000 | CHEWABLE_TABLET | Freq: Every day | ORAL | 11 refills | Status: DC

## 2018-09-30 NOTE — Patient Instructions (Addendum)
Can use Biofreeze on your rib area if needed. Take Tylenol 325 mg 2 tablets by mouth every 4 hours if needed for pain. Drink at least 8 8-oz glasses of water every day.    Costochondritis Costochondritis is swelling and irritation (inflammation) of the tissue (cartilage) that connects your ribs to your breastbone (sternum). This causes pain in the front of your chest. Usually, the pain:  Starts gradually.  Is in more than one rib. This condition usually goes away on its own over time. Follow these instructions at home:  Do not do anything that makes your pain worse.  If directed, put ice on the painful area: ? Put ice in a plastic bag. ? Place a towel between your skin and the bag. ? Leave the ice on for 20 minutes, 2-3 times a day.  If directed, put heat on the affected area as often as told by your doctor. Use the heat source that your doctor tells you to use, such as a moist heat pack or a heating pad. ? Place a towel between your skin and the heat source. ? Leave the heat on for 20-30 minutes. ? Take off the heat if your skin turns bright red. This is very important if you cannot feel pain, heat, or cold. You may have a greater risk of getting burned.  Take over-the-counter and prescription medicines only as told by your doctor.  Return to your normal activities as told by your doctor. Ask your doctor what activities are safe for you.  Keep all follow-up visits as told by your doctor. This is important. Contact a doctor if:  You have chills or a fever.  Your pain does not go away or it gets worse.  You have a cough that does not go away. Get help right away if:  You are short of breath. This information is not intended to replace advice given to you by your health care provider. Make sure you discuss any questions you have with your health care provider. Document Released: 12/31/2007 Document Revised: 02/01/2016 Document Reviewed: 11/07/2015 Elsevier Interactive  Patient Education  2019 [employer].

## 2018-09-30 NOTE — Telephone Encounter (Signed)
Patient called to say she was having pain under her rib. Spoke with [place] PA, to see if she was able to do a walk in. She said that would be fine. Informed patient she could come to our after hours clinic. Patient stated she would come in.

## 2018-09-30 NOTE — Progress Notes (Signed)
Laurie Sanders has notPhontaevia c/o rib pain under her left breast- that started 2 weeks ago and is getting worse. . States hasn't been coming to ob visits because she doesn't want the 17 p weekly shots. I encouraged her she still needs to come for ob visit. Informed her she will need fasting 2 hr gtt with her ob fu that is already scheduled for 10/04/18.

## 2018-09-30 NOTE — Progress Notes (Signed)
    Subjective:  Laurie Sanders is a 23 y.o. G2P0101 at [redacted]w[redacted]d being seen today for ongoing prenatal care.  She is currently monitored for the following issues for this low-risk pregnancy and has Asthma with status asthmaticus; Major depressive disorder, recurrent severe without psychotic features (HCC); Intentional drug overdose (HCC); Hx of preterm delivery, currently pregnant, first trimester; Supervision of high risk pregnancy, antepartum; Gonorrhea in pregnancy, antepartum, first trimester; and History of cesarean section, classical on their problem list.  OF NOTE:  Client has had 3 prenatal visits this pregnancy.  Has not been in the clinic since last 17P injection on 08-04-18 at 19 weeks.  No reason offered by client for not coming to appointments.  Reviewed phone number with her - her phone - mobile - does not have voice mail capability but will accept texts.  Patient reports pain in left ribs for 2 weeks.  Has not taken any tylenol for it..  Contractions: Not present. Vag. Bleeding: None.  Movement: Present. Denies leaking of fluid.   The following portions of the patient's history were reviewed and updated as appropriate: allergies, current medications, past family history, past medical history, past social history, past surgical history and problem list. Problem list updated.  Objective:   Vitals:   09/30/18 1711  BP: 104/69  Pulse: 99  Weight: 227 lb (103 kg)    Fetal Status: Fetal Heart Rate (bpm): 147 Fundal Height: 34 cm Movement: Present     General:  Alert, oriented and cooperative. Patient is in no acute distress.  Skin: Skin is warm and dry. No rash noted.   Cardiovascular: Normal heart rate noted  Respiratory: Normal respiratory effort, no problems with respiration noted  Abdomen: Soft, gravid, appropriate for gestational age. Pain/Pressure: Present     Pelvic:  Cervical exam deferred        Extremities: Normal range of motion.  Edema: Mild pitting, slight indentation    Mental Status: Normal mood and affect. Normal behavior. Normal judgment and thought content.  No RUQ pain  Urinalysis:      Assessment and Plan:  Pregnancy: G2P0101 at [redacted]w[redacted]d  1. Supervision of high risk pregnancy, antepartum costrocondritis on left anterior but lower ribs - Reviewed that this is short term and will resolve - client relieved.  Reviewed symptomatic management. Advised compression knee highs for minimal edema in ankles Advised client of need to keep appointments as now she needs to be seen on Monday and again every 2 weeks.  Reviewed risk of scar tissue separating in late pregnancy.   TDAP given today and advised to come to an early AM appointment fasting for lab tests.  2. History of cesarean section, classical Consult with Dr. Earlene Plater - Message sent to [facility] to schedule repeat C/S on 11-03-18 at [redacted]w[redacted]d  3. Hx of preterm delivery, currently pregnant, first trimester Stopped Makena in January - did not return for injections  Preterm labor symptoms and general obstetric precautions including but not limited to vaginal bleeding, contractions, leaking of fluid and fetal movement were reviewed in detail with the patient. Please refer to After Visit Summary for other counseling recommendations.  Return in about 4 days (around 10/04/2018) for has appt scheduled at 3:15 with MD - needs to see MD but needs fasting labs done in early AM.  Nolene Bernheim, RN, MSN, NP-BC Nurse Practitioner, [facility] for [employer], [facility] 09/30/2018 6:33 PM  \

## 2018-10-04 ENCOUNTER — Other Ambulatory Visit: Payer: Self-pay

## 2018-10-04 ENCOUNTER — Ambulatory Visit (INDEPENDENT_AMBULATORY_CARE_PROVIDER_SITE_OTHER): Payer: Medicaid Other | Admitting: Obstetrics & Gynecology

## 2018-10-04 VITALS — BP 118/62 | HR 93 | Wt 229.2 lb

## 2018-10-04 DIAGNOSIS — O0990 Supervision of high risk pregnancy, unspecified, unspecified trimester: Secondary | ICD-10-CM

## 2018-10-04 DIAGNOSIS — O09213 Supervision of pregnancy with history of pre-term labor, third trimester: Secondary | ICD-10-CM

## 2018-10-04 DIAGNOSIS — Z3A32 32 weeks gestation of pregnancy: Secondary | ICD-10-CM

## 2018-10-04 DIAGNOSIS — Z98891 History of uterine scar from previous surgery: Secondary | ICD-10-CM

## 2018-10-04 DIAGNOSIS — O09211 Supervision of pregnancy with history of pre-term labor, first trimester: Principal | ICD-10-CM

## 2018-10-04 DIAGNOSIS — O09891 Supervision of other high risk pregnancies, first trimester: Secondary | ICD-10-CM

## 2018-10-04 MED ORDER — HYDROXYPROGESTERONE CAPROATE 275 MG/1.1ML ~~LOC~~ SOAJ
275.0000 mg | Freq: Once | SUBCUTANEOUS | Status: AC
  Administered 2018-10-04: 275 mg via SUBCUTANEOUS

## 2018-10-04 NOTE — Progress Notes (Signed)
   PRENATAL VISIT NOTE  Subjective:  Laurie Sanders is a 23 y.o. G2P0101 at [redacted]w[redacted]d being seen today for ongoing prenatal care.  She is currently monitored for the following issues for this high-risk pregnancy and has Asthma with status asthmaticus; Major depressive disorder, recurrent severe without psychotic features (HCC); Intentional drug overdose (HCC); Hx of preterm delivery, currently pregnant, first trimester; Supervision of high risk pregnancy, antepartum; Gonorrhea in pregnancy, antepartum, first trimester; and History of cesarean section, classical on their problem list.  Patient reports no complaints.  Contractions: Not present. Vag. Bleeding: None.  Movement: Present. Denies leaking of fluid.   The following portions of the patient's history were reviewed and updated as appropriate: allergies, current medications, past family history, past medical history, past social history, past surgical history and problem list.   Objective:   Vitals:   10/04/18 1530  BP: 118/62  Pulse: 93  Weight: 229 lb 3.2 oz (104 kg)    Fetal Status: Fetal Heart Rate (bpm): 143 Fundal Height: 33 cm Movement: Present     General:  Alert, oriented and cooperative. Patient is in no acute distress.  Skin: Skin is warm and dry. No rash noted.   Cardiovascular: Normal heart rate noted  Respiratory: Normal respiratory effort, no problems with respiration noted  Abdomen: Soft, gravid, appropriate for gestational age.  Pain/Pressure: Absent     Pelvic: Cervical exam deferred        Extremities: Normal range of motion.  Edema: Trace  Mental Status: Normal mood and affect. Normal behavior. Normal judgment and thought content.   Assessment and Plan:  Pregnancy: G2P0101 at [redacted]w[redacted]d 1. Hx of preterm delivery, currently pregnant, first trimester Continue weekly 17P - HYDROXYprogesterone Caproate SOAJ 275 mg  2. History of cesarean section, classical RCS scheduled at 37 weeks  3. Supervision of high risk  pregnancy, antepartum Third trimester labs today.  - CBC - HIV Antibody (routine testing w rflx) - RPR - Glucose tolerance, 1 hour Preterm labor symptoms and general obstetric precautions including but not limited to vaginal bleeding, contractions, leaking of fluid and fetal movement were reviewed in detail with the patient. Please refer to After Visit Summary for other counseling recommendations.   Return in about 2 weeks (around 10/18/2018) for OB Visit (HOB).  Future Appointments  Date Time Provider Department Center  10/11/2018  9:30 AM [facility]    Jaynie Collins, MD

## 2018-10-04 NOTE — Patient Instructions (Signed)
Return to office for any scheduled appointments. Call the office or go to the MAU at [facility] if:  You begin to have strong, frequent contractions  Your water breaks.  Sometimes it is a big gush of fluid, sometimes it is just a trickle that keeps getting your panties wet or running down your legs  You have vaginal bleeding.  It is normal to have a small amount of spotting if your cervix was checked.   You do not feel your baby moving like normal.  If you do not, get something to eat and drink and lay down and focus on feeling your baby move.   If your baby is still not moving like normal, you should call the office or go to MAU.  Any other obstetric concerns.   

## 2018-10-05 LAB — CBC
Hematocrit: 28.8 % — ABNORMAL LOW (ref 34.0–46.6)
Hemoglobin: 9.9 g/dL — ABNORMAL LOW (ref 11.1–15.9)
MCH: 31.2 pg (ref 26.6–33.0)
MCHC: 34.4 g/dL (ref 31.5–35.7)
MCV: 91 fL (ref 79–97)
PLATELETS: 312 10*3/uL (ref 150–450)
RBC: 3.17 x10E6/uL — AB (ref 3.77–5.28)
RDW: 12.5 % (ref 11.7–15.4)
WBC: 17.2 10*3/uL — ABNORMAL HIGH (ref 3.4–10.8)

## 2018-10-05 LAB — HIV ANTIBODY (ROUTINE TESTING W REFLEX): HIV Screen 4th Generation wRfx: NONREACTIVE

## 2018-10-05 LAB — RPR: RPR Ser Ql: NONREACTIVE

## 2018-10-05 LAB — GLUCOSE TOLERANCE, 1 HOUR: Glucose, 1Hr PP: 101 mg/dL (ref 65–199)

## 2018-10-06 ENCOUNTER — Telehealth: Payer: Self-pay

## 2018-10-06 ENCOUNTER — Encounter: Payer: Self-pay | Admitting: Obstetrics & Gynecology

## 2018-10-06 DIAGNOSIS — O99013 Anemia complicating pregnancy, third trimester: Secondary | ICD-10-CM | POA: Insufficient documentation

## 2018-10-06 NOTE — Telephone Encounter (Addendum)
-----   Message from Tereso Newcomer, MD sent at 10/06/2018  3:35 PM EDT ----- Mild anemia with hemoglobin of 9.9.  Given that she has an upcoming cesarean section, will order for Feraheme x 2 doses to increase her hemoglobin prior to surgery. Order signed and held.  Please schedule outpatient iron infusion for patient. Please call to inform patient of results and recommendations.  Jaynie Collins, MD   Scheduled iron infusion for March 20th @ 1200.  Notified pt of results and iron infustion appt.  I also gave pt contact info on location and phone # for infusion.  Pt stated understanding with no further questions

## 2018-10-06 NOTE — Addendum Note (Signed)
Addended by: Jaynie Collins A on: 10/06/2018 03:33 PM   Modules accepted: Orders

## 2018-10-06 NOTE — Progress Notes (Signed)
Mild anemia with hemoglobin of 9.9.  Given that she has an upcoming cesarean section, will order for Feraheme x 2 doses to increase her hemoglobin prior to surgery. Order signed and held.  Please schedule outpatient iron infusion for patient. Please call to inform patient of results and recommendations.  Jaynie Collins, MD

## 2018-10-08 ENCOUNTER — Encounter (HOSPITAL_COMMUNITY): Payer: Self-pay | Admitting: *Deleted

## 2018-10-08 ENCOUNTER — Inpatient Hospital Stay (HOSPITAL_COMMUNITY)
Admission: AD | Admit: 2018-10-08 | Discharge: 2018-10-09 | Disposition: A | Discharge status: Home / Self Care | Payer: Medicaid Other | Source: Ambulatory Visit | Attending: Obstetrics & Gynecology | Admitting: Obstetrics & Gynecology

## 2018-10-08 ENCOUNTER — Other Ambulatory Visit: Payer: Self-pay

## 2018-10-08 DIAGNOSIS — O368130 Decreased fetal movements, third trimester, not applicable or unspecified: Secondary | ICD-10-CM | POA: Insufficient documentation

## 2018-10-08 DIAGNOSIS — O99333 Smoking (tobacco) complicating pregnancy, third trimester: Secondary | ICD-10-CM | POA: Diagnosis not present

## 2018-10-08 DIAGNOSIS — K8020 Calculus of gallbladder without cholecystitis without obstruction: Secondary | ICD-10-CM | POA: Diagnosis not present

## 2018-10-08 DIAGNOSIS — F17210 Nicotine dependence, cigarettes, uncomplicated: Secondary | ICD-10-CM | POA: Insufficient documentation

## 2018-10-08 DIAGNOSIS — Z3689 Encounter for other specified antenatal screening: Secondary | ICD-10-CM | POA: Diagnosis not present

## 2018-10-08 DIAGNOSIS — Z3A33 33 weeks gestation of pregnancy: Secondary | ICD-10-CM | POA: Diagnosis not present

## 2018-10-08 DIAGNOSIS — E860 Dehydration: Secondary | ICD-10-CM

## 2018-10-08 DIAGNOSIS — O26899 Other specified pregnancy related conditions, unspecified trimester: Secondary | ICD-10-CM

## 2018-10-08 DIAGNOSIS — O26613 Liver and biliary tract disorders in pregnancy, third trimester: Secondary | ICD-10-CM | POA: Insufficient documentation

## 2018-10-08 DIAGNOSIS — O26893 Other specified pregnancy related conditions, third trimester: Secondary | ICD-10-CM | POA: Diagnosis not present

## 2018-10-08 DIAGNOSIS — R1011 Right upper quadrant pain: Secondary | ICD-10-CM

## 2018-10-08 NOTE — MAU Note (Signed)
Pt presents to MAU stating she was called this morning by the doctors and told she was anemic. Since then she has noticed cold feet x1 hour. Pt reports feeling like food was in her throat like she was going to throw up, no vomiting. Pt reports RUQ pain under breast. SOB. No bleeding or LOF. DFM.

## 2018-10-09 ENCOUNTER — Inpatient Hospital Stay (HOSPITAL_COMMUNITY): Payer: Medicaid Other

## 2018-10-09 ENCOUNTER — Encounter (HOSPITAL_COMMUNITY): Payer: Self-pay | Admitting: *Deleted

## 2018-10-09 DIAGNOSIS — R1011 Right upper quadrant pain: Secondary | ICD-10-CM

## 2018-10-09 DIAGNOSIS — E860 Dehydration: Secondary | ICD-10-CM

## 2018-10-09 DIAGNOSIS — K8020 Calculus of gallbladder without cholecystitis without obstruction: Secondary | ICD-10-CM

## 2018-10-09 DIAGNOSIS — O26893 Other specified pregnancy related conditions, third trimester: Secondary | ICD-10-CM

## 2018-10-09 DIAGNOSIS — Z3689 Encounter for other specified antenatal screening: Secondary | ICD-10-CM

## 2018-10-09 DIAGNOSIS — O26613 Liver and biliary tract disorders in pregnancy, third trimester: Secondary | ICD-10-CM

## 2018-10-09 DIAGNOSIS — Z3A33 33 weeks gestation of pregnancy: Secondary | ICD-10-CM

## 2018-10-09 LAB — CBC
HCT: 32.2 % — ABNORMAL LOW (ref 36.0–46.0)
Hemoglobin: 10 g/dL — ABNORMAL LOW (ref 12.0–15.0)
MCH: 30.6 pg (ref 26.0–34.0)
MCHC: 31.1 g/dL (ref 30.0–36.0)
MCV: 98.5 fL (ref 80.0–100.0)
Platelets: 315 10*3/uL (ref 150–400)
RBC: 3.27 MIL/uL — ABNORMAL LOW (ref 3.87–5.11)
RDW: 13.4 % (ref 11.5–15.5)
WBC: 16.9 10*3/uL — ABNORMAL HIGH (ref 4.0–10.5)
nRBC: 0 % (ref 0.0–0.2)

## 2018-10-09 LAB — URINALYSIS, ROUTINE W REFLEX MICROSCOPIC
Bilirubin Urine: NEGATIVE
Glucose, UA: NEGATIVE mg/dL
Hgb urine dipstick: NEGATIVE
Ketones, ur: NEGATIVE mg/dL
Leukocytes,Ua: NEGATIVE
Nitrite: NEGATIVE
Protein, ur: NEGATIVE mg/dL
Specific Gravity, Urine: 1.015 (ref 1.005–1.030)
pH: 7 (ref 5.0–8.0)

## 2018-10-09 LAB — LIPASE, BLOOD: Lipase: 39 U/L (ref 11–51)

## 2018-10-09 LAB — COMPREHENSIVE METABOLIC PANEL
ALT: 12 U/L (ref 0–44)
AST: 18 U/L (ref 15–41)
Albumin: 2.8 g/dL — ABNORMAL LOW (ref 3.5–5.0)
Alkaline Phosphatase: 83 U/L (ref 38–126)
Anion gap: 8 (ref 5–15)
BUN: 5 mg/dL — ABNORMAL LOW (ref 6–20)
CO2: 21 mmol/L — ABNORMAL LOW (ref 22–32)
Calcium: 9.5 mg/dL (ref 8.9–10.3)
Chloride: 108 mmol/L (ref 98–111)
Creatinine, Ser: 0.68 mg/dL (ref 0.44–1.00)
GFR calc Af Amer: >60 mL/min (ref >60)
GFR calc non Af Amer: >60 mL/min (ref >60)
Glucose, Bld: 89 mg/dL (ref 70–99)
Potassium: 3.4 mmol/L — ABNORMAL LOW (ref 3.5–5.1)
Sodium: 137 mmol/L (ref 135–145)
Total Bilirubin: 0.3 mg/dL (ref 0.3–1.2)
Total Protein: 6.5 g/dL (ref 6.5–8.1)

## 2018-10-09 LAB — PROTEIN / CREATININE RATIO, URINE
Creatinine, Urine: 134.98 mg/dL
Protein Creatinine Ratio: 0.27 mg/mg{Cre} — ABNORMAL HIGH (ref 0.00–0.15)
Total Protein, Urine: 37 mg/dL

## 2018-10-09 NOTE — MAU Provider Note (Addendum)
History     CSN: [id number]  Arrival date and time: 10/08/18 2326   First Provider Initiated Contact with Patient 10/08/18 2355     Chief Complaint  Patient presents with  . Abdominal Pain  . Decreased Fetal Movement   Laurie Sanders is a 23 y.o. G2P1 at [redacted]w[redacted]d who presents to MAU with complaints of abdominal pain and SOB. She reports RUQ abdominal pain that started this evening, describes the pain as sharp stabbing pain under her right breast, rates pain 8/10- has not taken any medication for abdominal pain. Denies lower abdominal pain or cramping. Reports SOB when she changes position and when she was standing in restroom. Denies fever, recent travel or being around anyone that has been sick. Hx of anemia during pregnancy- reports feet have been cold for 1 hour prior to coming to MAU, plans to receive iron transfusion on Tuesday. Reports she has not drank any water over the course of today. Reports decreased fetal movement.    OB History    Gravida  2   Para  1   Term  0   Preterm  1   AB  0   Living  1     SAB  0   TAB  0   Ectopic  0   Multiple  0   Live Births  1        Obstetric Comments  G1- per op note, classical incision        Past Medical History:  Diagnosis Date  . Asthma   . Preterm premature rupture of membranes in second trimester 04/29/2011    Past Surgical History:  Procedure Laterality Date  . CESAREAN SECTION  05/09/2011   Procedure: CESAREAN SECTION;  Surgeon: Catalina Antigua, MD;  Location: [facility];  Service: Gynecology;  Laterality: N/A;    Family History  Problem Relation Age of Onset  . Diabetes Mother   . Asthma Mother   . Asthma Father   . Hypertension Father     Social History   Tobacco Use  . Smoking status: Light Tobacco Smoker    Packs/day: 0.25    Types: Cigarettes  . Smokeless tobacco: Never Used  Substance Use Topics  . Alcohol use: No  . Drug use: No    Allergies:  Allergies  Allergen Reactions  .  Benadryl [Diphenhydramine Hcl] Hives  . Kiwi Extract Swelling    Lips swell    Medications Prior to Admission  Medication Sig Dispense Refill Last Dose  . Prenatal Vit-Fe Phos-FA-Omega (VITAFOL GUMMIES) 3.33-0.333-34.8 MG CHEW Chew 3 each by mouth daily. 90 tablet 11 10/07/2018 at Unknown time  . albuterol (PROVENTIL HFA;VENTOLIN HFA) 108 (90 BASE) MCG/ACT inhaler Inhale 2 puffs into the lungs every 4 (four) hours as needed for wheezing. 1 Inhaler 1 More than a month at Unknown time  . metoCLOPramide (REGLAN) 10 MG tablet Take 1 tablet (10 mg total) by mouth every 6 (six) hours. (Patient not taking: Reported on 06/21/2018) 30 tablet 0 Not Taking    Review of Systems  Constitutional: Negative.   Respiratory: Positive for shortness of breath. Negative for cough, chest tightness and wheezing.   Cardiovascular: Negative.   Gastrointestinal: Positive for abdominal pain. Negative for constipation, diarrhea, nausea and vomiting.  Genitourinary: Negative.   Musculoskeletal: Negative.    FHR: 130/moderate/+accels/ no decelerations  Toco: UI no UC   Physical Exam   Blood pressure 127/67, pulse (!) 102, temperature 98.3 F (36.8 C), temperature source Oral, resp. rate  17, height 5\' 5"  (1.651 m), weight 104.3 kg, last menstrual period 02/17/2018.  Physical Exam  Nursing note and vitals reviewed. Constitutional: She appears well-developed and well-nourished. No distress.  Cardiovascular: Normal rate, regular rhythm and normal heart sounds.  No murmur heard. Respiratory: Effort normal and breath sounds normal. No respiratory distress. She has no wheezes. She has no rales.  GI: Soft. There is abdominal tenderness. There is no rebound and no guarding.  Right upper abdominal tenderness, negative Murphy's sign   Musculoskeletal: Normal range of motion.        General: No edema.  Neurological: She is alert. She displays normal reflexes.  Skin: Skin is warm and dry.  Psychiatric: She has a normal  mood and affect. Her behavior is normal. Thought content normal.   Pelvic examination deferred, denies lower abdominal pain/cramping or vaginal bleeding  MAU Course  Procedures  MDM Orders Placed This Encounter  Procedures  . US ABDOMEN LIMITED RUQ  . Urinalysis, Routine w reflex microscopic  . CBC  . Comprehensive metabolic panel  . Protein / creatinine ratio, urine  . Lipase, blood  . ED EKG   ED EKG- sinus tachycardia, otherwise normal  Tachycardia most likely due to dehydration and no consumption of water today.   Patient reports DFM is resolved since arrival to MAU, reports she if feeling movement more after hydration.   Labs and [place] report reviewed:  Results for orders placed or performed during the hospital encounter of 10/08/18 (from the past 24 hour(s))  Urinalysis, Routine w reflex microscopic     Status: Abnormal   Collection Time: 10/08/18 11:33 PM  Result Value Ref Range   Color, Urine YELLOW YELLOW   APPearance CLOUDY (A) CLEAR   Specific Gravity, Urine 1.015 1.005 - 1.030   pH 7.0 5.0 - 8.0   Glucose, UA NEGATIVE NEGATIVE mg/dL   Hgb urine dipstick NEGATIVE NEGATIVE   Bilirubin Urine NEGATIVE NEGATIVE   Ketones, ur NEGATIVE NEGATIVE mg/dL   Protein, ur NEGATIVE NEGATIVE mg/dL   Nitrite NEGATIVE NEGATIVE   Leukocytes,Ua NEGATIVE NEGATIVE  CBC     Status: Abnormal   Collection Time: 10/08/18 11:57 PM  Result Value Ref Range   WBC 16.9 (H) 4.0 - 10.5 K/uL   RBC 3.27 (L) 3.87 - 5.11 MIL/uL   Hemoglobin 10.0 (L) 12.0 - 15.0 g/dL   HCT 40.0 (L) 86.7 - 61.9 %   MCV 98.5 80.0 - 100.0 fL   MCH 30.6 26.0 - 34.0 pg   MCHC 31.1 30.0 - 36.0 g/dL   RDW 50.9 32.6 - 71.2 %   Platelets 315 150 - 400 K/uL   nRBC 0.0 0.0 - 0.2 %  Comprehensive metabolic panel     Status: Abnormal   Collection Time: 10/08/18 11:57 PM  Result Value Ref Range   Sodium 137 135 - 145 mmol/L   Potassium 3.4 (L) 3.5 - 5.1 mmol/L   Chloride 108 98 - 111 mmol/L   CO2 21 (L) 22 - 32 mmol/L    Glucose, Bld 89 70 - 99 mg/dL   BUN 5 (L) 6 - 20 mg/dL   Creatinine, Ser 4.58 0.44 - 1.00 mg/dL   Calcium 9.5 8.9 - 09.9 mg/dL   Total Protein 6.5 6.5 - 8.1 g/dL   Albumin 2.8 (L) 3.5 - 5.0 g/dL   AST 18 15 - 41 U/L   ALT 12 0 - 44 U/L   Alkaline Phosphatase 83 38 - 126 U/L   Total Bilirubin 0.3  0.3 - 1.2 mg/dL   GFR calc non Af Amer >60 >60 mL/min   GFR calc Af Amer >60 >60 mL/min   Anion gap 8 5 - 15  Protein / creatinine ratio, urine     Status: Abnormal   Collection Time: 10/08/18 11:57 PM  Result Value Ref Range   Creatinine, Urine 134.98 mg/dL   Total Protein, Urine 37 mg/dL   Protein Creatinine Ratio 0.27 (H) 0.00 - 0.15 mg/mg[Cre]  Lipase, blood     Status: None   Collection Time: 10/09/18 12:13 AM  Result Value Ref Range   Lipase 39 11 - 51 U/L   US Abdomen Limited Ruq  Result Date: 10/09/2018 CLINICAL DATA:  Right upper quadrant abdominal pain during pregnancy. Patient is 33 weeks 3 days pregnant. EXAM: ULTRASOUND ABDOMEN LIMITED RIGHT UPPER QUADRANT COMPARISON:  None. FINDINGS: Gallbladder: Cholelithiasis with several shadowing stones in the gallbladder measuring up to 1.4 cm in diameter. Mild gallbladder wall thickening at 3.9 mm. No edema. Murphy's sign is negative. Common bile duct: Diameter: 2.4 mm, normal Liver: No focal lesion identified. Within normal limits in parenchymal echogenicity. Portal vein is patent on color Doppler imaging with normal direction of blood flow towards the liver. IMPRESSION: Cholelithiasis and mild gallbladder wall thickening. Murphy's sign is negative. Electronically Signed   By: Burman Nieves M.D.   On: 10/09/2018 01:08   Treatments in MAU included PO hydration, patient reports SOB and RUQ pain is resolved. Discussed results of [place] with patient and discussed gallbladder stones. Educated on eating plan with gallbladder and to not exacerbate symptoms by eating greasy or fatty foods. Patient verbalizes understanding.  Management of  cholelithiasis PP. Pt stable at time of discharge.   Assessment and Plan   1. Cholelithiasis affecting pregnancy in third trimester, antepartum   2. Right upper quadrant abdominal pain during pregnancy   3. [redacted] weeks gestation of pregnancy   4. NST (non-stress test) reactive   5. Dehydration during pregnancy    Discharge home Follow up as scheduled for prenatal appointment on Monday  Return to MAU as needed Discussed water consumption of 8-10 glasses of water per day  Reviewed eating plan with Cholelithiasis  NST reactive   Follow-up Information    [facility] for [facility]. Go on 10/11/2018.   Specialty:  Obstetrics and Gynecology Why:  Follow up as scheduled for prenatal appointment on Monday  Contact information: [address] [place] [address] [phone]         Allergies as of 10/09/2018      Reactions   Benadryl [diphenhydramine Hcl] Hives   Kiwi Extract Swelling   Lips swell      Medication List    TAKE these medications   albuterol 108 (90 Base) MCG/ACT inhaler Commonly known as:  PROVENTIL HFA;VENTOLIN HFA Inhale 2 puffs into the lungs every 4 (four) hours as needed for wheezing.   metoCLOPramide 10 MG tablet Commonly known as:  REGLAN Take 1 tablet (10 mg total) by mouth every 6 (six) hours.   Vitafol Gummies 3.33-0.333-34.8 MG Chew Chew 3 each by mouth daily.      Sharyon Cable CNM 10/09/2018, 2:04 AM

## 2018-10-11 ENCOUNTER — Telehealth: Payer: Self-pay | Admitting: *Deleted

## 2018-10-11 ENCOUNTER — Other Ambulatory Visit: Payer: Self-pay | Admitting: *Deleted

## 2018-10-11 ENCOUNTER — Other Ambulatory Visit: Payer: Self-pay

## 2018-10-11 NOTE — Telephone Encounter (Signed)
Called pt to inform her that she did not need to come to her lab appointment today per Dr. Macon Large because she already passed a 1 hr gtt.  Pt did not pick up.  Left voicemail informing pt of the cancellation of her appointment.

## 2018-10-14 ENCOUNTER — Other Ambulatory Visit (HOSPITAL_COMMUNITY): Payer: Self-pay | Admitting: *Deleted

## 2018-10-15 ENCOUNTER — Ambulatory Visit (HOSPITAL_COMMUNITY)
Admission: RE | Admit: 2018-10-15 | Discharge: 2018-10-15 | Disposition: A | Discharge status: Home / Self Care | Payer: Medicaid Other | Source: Ambulatory Visit | Attending: Obstetrics & Gynecology | Admitting: Obstetrics & Gynecology

## 2018-10-15 ENCOUNTER — Other Ambulatory Visit: Payer: Self-pay

## 2018-10-15 DIAGNOSIS — O99013 Anemia complicating pregnancy, third trimester: Secondary | ICD-10-CM | POA: Insufficient documentation

## 2018-10-15 MED ORDER — SODIUM CHLORIDE 0.9 % IV SOLN
510.0000 mg | INTRAVENOUS | Status: DC
  Administered 2018-10-15: 510 mg via INTRAVENOUS
  Filled 2018-10-15: qty 17

## 2018-10-15 NOTE — Discharge Instructions (Signed)

## 2018-10-20 ENCOUNTER — Telehealth: Payer: Self-pay

## 2018-10-20 ENCOUNTER — Encounter (HOSPITAL_COMMUNITY)
Admission: RE | Admit: 2018-10-20 | Discharge: 2018-10-20 | Disposition: A | Discharge status: Home / Self Care | Payer: Medicaid Other | Source: Ambulatory Visit | Attending: Obstetrics & Gynecology | Admitting: Obstetrics & Gynecology

## 2018-10-20 ENCOUNTER — Telehealth: Payer: Self-pay | Admitting: Lactation Services

## 2018-10-20 ENCOUNTER — Ambulatory Visit (INDEPENDENT_AMBULATORY_CARE_PROVIDER_SITE_OTHER): Payer: Medicaid Other | Admitting: Family Medicine

## 2018-10-20 ENCOUNTER — Other Ambulatory Visit: Payer: Self-pay

## 2018-10-20 VITALS — BP 119/64 | HR 103 | Wt 235.4 lb

## 2018-10-20 DIAGNOSIS — O09213 Supervision of pregnancy with history of pre-term labor, third trimester: Secondary | ICD-10-CM

## 2018-10-20 DIAGNOSIS — Z3A00 Weeks of gestation of pregnancy not specified: Secondary | ICD-10-CM | POA: Diagnosis not present

## 2018-10-20 DIAGNOSIS — O09891 Supervision of other high risk pregnancies, first trimester: Secondary | ICD-10-CM

## 2018-10-20 DIAGNOSIS — Z98891 History of uterine scar from previous surgery: Secondary | ICD-10-CM

## 2018-10-20 DIAGNOSIS — O09211 Supervision of pregnancy with history of pre-term labor, first trimester: Secondary | ICD-10-CM

## 2018-10-20 DIAGNOSIS — O99013 Anemia complicating pregnancy, third trimester: Secondary | ICD-10-CM | POA: Insufficient documentation

## 2018-10-20 DIAGNOSIS — Z3A35 35 weeks gestation of pregnancy: Secondary | ICD-10-CM

## 2018-10-20 DIAGNOSIS — O0993 Supervision of high risk pregnancy, unspecified, third trimester: Secondary | ICD-10-CM

## 2018-10-20 DIAGNOSIS — O0990 Supervision of high risk pregnancy, unspecified, unspecified trimester: Secondary | ICD-10-CM

## 2018-10-20 MED ORDER — SODIUM CHLORIDE 0.9 % IV SOLN
510.0000 mg | INTRAVENOUS | Status: DC
  Administered 2018-10-20: 510 mg via INTRAVENOUS
  Filled 2018-10-20: qty 510

## 2018-10-20 NOTE — Progress Notes (Unsigned)
Spoke with provider Tinnie Gens, MD to advise that [facility] order had been canceled by some one on 10/18/18, stating pt had already delivered. Dr. Shawnie Pons stated never mind & to cancel order.

## 2018-10-20 NOTE — Telephone Encounter (Signed)
Called [facility] at [phone] and spoke with Chalmers Guest stated pt was receiving Auto Refills on Makena, but on 10/18/2018 he stated received a call that Pt had delivered. Charlott Holler that pt has nopt delivered and she needs 2 more Injections. Fayrene Fearing stated that will send [facility] via [employer] by Friday, verified address and area for drop off with him.

## 2018-10-20 NOTE — Patient Instructions (Signed)

## 2018-10-20 NOTE — Telephone Encounter (Signed)
Spoke with pt while in the office for her OB visit. Mom is planning to formula feed her infant. Enc mom to BF or to pump to provide breast milk for infant in light of the Corona Virus going on right now. Discussed Corona Virus is not found in breast milk but antibodies are. Discussed there is also a shortage of formula going on also.   Reviewed BF teaching with mom. Mom asked about leaking of colostrum, discussed this is normal.   Mom does not have a pump at home, discussed she can get a manual pump in the hospital. She may be able to obtain a pump through [facility] depending on infant status and package chosen.   Mom reports she has no further questions/concerns. Enc mom to seek BF help in the hospital and OP as needed. Discussed WIC is also helpful with BF questions/concerns.

## 2018-10-20 NOTE — Telephone Encounter (Signed)
Called [facility] back at [phone] to let them know per provider Tinnie Gens MD, to cancel pts  [facility] Order, no answer, had to leave detailed message.

## 2018-10-21 NOTE — Progress Notes (Signed)
   PRENATAL VISIT NOTE  Subjective:  Laurie Sanders is a 23 y.o. G2P0101 at [redacted]w[redacted]d being seen today for ongoing prenatal care.  She is currently monitored for the following issues for this high-risk pregnancy and has Asthma with status asthmaticus; Major depressive disorder, recurrent severe without psychotic features (HCC); Intentional drug overdose (HCC); Hx of preterm delivery, currently pregnant, first trimester; Supervision of high risk pregnancy, antepartum; Gonorrhea in pregnancy, antepartum, first trimester; History of cesarean section, classical; and Anemia during pregnancy in third trimester on their problem list.  Patient reports no complaints.  Contractions: Not present. Vag. Bleeding: None.  Movement: Present. Denies leaking of fluid.   The following portions of the patient's history were reviewed and updated as appropriate: allergies, current medications, past family history, past medical history, past social history, past surgical history and problem list.   Objective:   Vitals:   10/20/18 0944  BP: 119/64  Pulse: (!) 103  Weight: 235 lb 6.4 oz (106.8 kg)    Fetal Status: Fetal Heart Rate (bpm): 140   Movement: Present     General:  Alert, oriented and cooperative. Patient is in no acute distress.  Skin: Skin is warm and dry. No rash noted.   Cardiovascular: Normal heart rate noted  Respiratory: Normal respiratory effort, no problems with respiration noted  Abdomen: Soft, gravid, appropriate for gestational age.  Pain/Pressure: Present     Pelvic: Cervical exam deferred        Extremities: Normal range of motion.  Edema: None  Mental Status: Normal mood and affect. Normal behavior. Normal judgment and thought content.   Assessment and Plan:  Pregnancy: G2P0101 at [redacted]w[redacted]d 1. Supervision of high risk pregnancy, antepartum Continue prenatal care. Virtual check in next week. She will get BP done at [facility] or the like   2. Hx of preterm delivery, currently pregnant, first  trimester On 17 P, surgery scheduled  3. History of cesarean section, classical For RCS at 37 wks  Preterm labor symptoms and general obstetric precautions including but not limited to vaginal bleeding, contractions, leaking of fluid and fetal movement were reviewed in detail with the patient. Please refer to After Visit Summary for other counseling recommendations.   Return in 1 week (on 10/27/2018) for virtual - will check BP outside, virtual pp visit in 6 wks.  Future Appointments  Date Time Provider Department Center  10/27/2018  2:30 PM [facility] NURSE [facility]    Reva Bores, MD

## 2018-10-26 ENCOUNTER — Other Ambulatory Visit: Payer: Self-pay | Admitting: Family Medicine

## 2018-10-26 NOTE — Progress Notes (Signed)
Repeat [facility] orders placed.  Gwenevere Abbot, MD 10/26/18 8:18 PM

## 2018-10-27 ENCOUNTER — Encounter (HOSPITAL_COMMUNITY): Payer: Self-pay

## 2018-10-27 ENCOUNTER — Ambulatory Visit (INDEPENDENT_AMBULATORY_CARE_PROVIDER_SITE_OTHER): Payer: Medicaid Other | Admitting: *Deleted

## 2018-10-27 ENCOUNTER — Other Ambulatory Visit: Payer: Self-pay

## 2018-10-27 VITALS — Temp 98.6°F

## 2018-10-27 DIAGNOSIS — O09891 Supervision of other high risk pregnancies, first trimester: Secondary | ICD-10-CM

## 2018-10-27 DIAGNOSIS — O0990 Supervision of high risk pregnancy, unspecified, unspecified trimester: Secondary | ICD-10-CM

## 2018-10-27 DIAGNOSIS — O0991 Supervision of high risk pregnancy, unspecified, first trimester: Secondary | ICD-10-CM

## 2018-10-27 DIAGNOSIS — O09211 Supervision of pregnancy with history of pre-term labor, first trimester: Secondary | ICD-10-CM

## 2018-10-27 NOTE — Patient Instructions (Signed)
Laurie Sanders  10/27/2018   Your procedure is scheduled on:  11/05/2018  Arrive at 0800 at [employer] [facility] [address] at [facility] and [employer]. You are invited to use the FREE valet parking or use the Visitor's parking deck.  Pick up the phone at the desk and dial [phone].  Call this number if you have problems the morning of surgery: [phone]  Remember:   Do not eat food:(After Midnight) Desps de medianoche.  Do not drink clear liquids: (After Midnight) Desps de medianoche.  Take these medicines the morning of surgery with A SIP OF WATER:  none   Do not wear jewelry, make-up or nail polish.  Do not wear lotions, powders, or perfumes. Do not wear deodorant.  Do not shave 48 hours prior to surgery.  Do not bring valuables to the hospital.  [facility] is not   responsible for any belongings or valuables brought to the hospital.  Contacts, dentures or bridgework may not be worn into surgery.  Leave suitcase in the car. After surgery it may be brought to your room.  For patients admitted to the hospital, checkout time is 11:00 AM the day of              discharge.      Please read over the following fact sheets that you were given:     Preparing for Surgery

## 2018-10-27 NOTE — Progress Notes (Signed)
Here for blood pressure check, is 36 weeks,. BP manually 128/70.  Advised to go to MAU if signs of labor, decreased fetal movement, leaking or bleeding or other urgent concerns. She has IOL 11/04/18. She voices understanding.  Linda,RN

## 2018-10-27 NOTE — Progress Notes (Signed)
I have reviewed this chart and agree with the RN/CMA assessment and management.    K. Meryl Tyreik Delahoussaye, M.D. Attending [facility] (Faculty Practice)   

## 2018-11-04 ENCOUNTER — Encounter (HOSPITAL_COMMUNITY)
Admission: RE | Admit: 2018-11-04 | Discharge: 2018-11-04 | Disposition: A | Discharge status: Home / Self Care | Payer: Medicaid Other | Source: Ambulatory Visit

## 2018-11-05 ENCOUNTER — Inpatient Hospital Stay (HOSPITAL_COMMUNITY)
Admission: RE | Admit: 2018-11-05 | Discharge: 2018-11-07 | DRG: 787 | Disposition: A | Discharge status: Home / Self Care | Payer: Medicaid Other | Attending: Obstetrics and Gynecology | Admitting: Obstetrics and Gynecology

## 2018-11-05 ENCOUNTER — Encounter (HOSPITAL_COMMUNITY)
Admission: RE | Disposition: A | Discharge status: Home / Self Care | Payer: Self-pay | Source: Home / Self Care | Attending: Obstetrics and Gynecology

## 2018-11-05 ENCOUNTER — Inpatient Hospital Stay (HOSPITAL_COMMUNITY): Payer: Medicaid Other | Admitting: Certified Registered"

## 2018-11-05 ENCOUNTER — Other Ambulatory Visit: Payer: Self-pay

## 2018-11-05 ENCOUNTER — Encounter (HOSPITAL_COMMUNITY): Payer: Self-pay | Admitting: *Deleted

## 2018-11-05 DIAGNOSIS — D62 Acute posthemorrhagic anemia: Secondary | ICD-10-CM | POA: Diagnosis not present

## 2018-11-05 DIAGNOSIS — K8020 Calculus of gallbladder without cholecystitis without obstruction: Secondary | ICD-10-CM | POA: Diagnosis present

## 2018-11-05 DIAGNOSIS — O99334 Smoking (tobacco) complicating childbirth: Secondary | ICD-10-CM | POA: Diagnosis present

## 2018-11-05 DIAGNOSIS — Z9189 Other specified personal risk factors, not elsewhere classified: Secondary | ICD-10-CM

## 2018-11-05 DIAGNOSIS — O2662 Liver and biliary tract disorders in childbirth: Secondary | ICD-10-CM | POA: Diagnosis present

## 2018-11-05 DIAGNOSIS — F17210 Nicotine dependence, cigarettes, uncomplicated: Secondary | ICD-10-CM | POA: Diagnosis present

## 2018-11-05 DIAGNOSIS — O9081 Anemia of the puerperium: Secondary | ICD-10-CM | POA: Diagnosis not present

## 2018-11-05 DIAGNOSIS — O34212 Maternal care for vertical scar from previous cesarean delivery: Secondary | ICD-10-CM | POA: Diagnosis present

## 2018-11-05 DIAGNOSIS — O9952 Diseases of the respiratory system complicating childbirth: Secondary | ICD-10-CM | POA: Diagnosis present

## 2018-11-05 DIAGNOSIS — O99013 Anemia complicating pregnancy, third trimester: Secondary | ICD-10-CM | POA: Diagnosis present

## 2018-11-05 DIAGNOSIS — O34211 Maternal care for low transverse scar from previous cesarean delivery: Secondary | ICD-10-CM

## 2018-11-05 DIAGNOSIS — Z3A37 37 weeks gestation of pregnancy: Secondary | ICD-10-CM

## 2018-11-05 DIAGNOSIS — N736 Female pelvic peritoneal adhesions (postinfective): Secondary | ICD-10-CM | POA: Diagnosis present

## 2018-11-05 DIAGNOSIS — O26613 Liver and biliary tract disorders in pregnancy, third trimester: Secondary | ICD-10-CM

## 2018-11-05 DIAGNOSIS — Z98891 History of uterine scar from previous surgery: Secondary | ICD-10-CM

## 2018-11-05 DIAGNOSIS — J45909 Unspecified asthma, uncomplicated: Secondary | ICD-10-CM | POA: Diagnosis present

## 2018-11-05 LAB — TYPE AND SCREEN
ABO/RH(D): O POS
Antibody Screen: NEGATIVE

## 2018-11-05 LAB — CBC
HCT: 34.6 % — ABNORMAL LOW (ref 36.0–46.0)
Hemoglobin: 10.8 g/dL — ABNORMAL LOW (ref 12.0–15.0)
MCH: 31.3 pg (ref 26.0–34.0)
MCHC: 31.2 g/dL (ref 30.0–36.0)
MCV: 100.3 fL — ABNORMAL HIGH (ref 80.0–100.0)
Platelets: 326 10*3/uL (ref 150–400)
RBC: 3.45 MIL/uL — ABNORMAL LOW (ref 3.87–5.11)
RDW: 15.5 % (ref 11.5–15.5)
WBC: 13 10*3/uL — ABNORMAL HIGH (ref 4.0–10.5)
nRBC: 0 % (ref 0.0–0.2)

## 2018-11-05 LAB — RAPID HIV SCREEN (HIV 1/2 AB+AG)
HIV 1/2 Antibodies: NONREACTIVE
HIV-1 P24 Antigen - HIV24: NONREACTIVE

## 2018-11-05 LAB — ABO/RH: ABO/RH(D): O POS

## 2018-11-05 SURGERY — Surgical Case
Anesthesia: Regional | Site: Abdomen | Wound class: Clean Contaminated

## 2018-11-05 MED ORDER — LACTATED RINGERS IV SOLN
INTRAVENOUS | Status: DC | PRN
  Administered 2018-11-05 (×3): via INTRAVENOUS

## 2018-11-05 MED ORDER — WITCH HAZEL-GLYCERIN EX PADS: 1.0000 "application " | MEDICATED_PAD | CUTANEOUS | Status: DC | PRN

## 2018-11-05 MED ORDER — METOCLOPRAMIDE HCL 5 MG/ML IJ SOLN
INTRAMUSCULAR | Status: AC
  Filled 2018-11-05: qty 2

## 2018-11-05 MED ORDER — SODIUM CHLORIDE 0.9% FLUSH: 3.0000 mL | INTRAVENOUS | Status: DC | PRN

## 2018-11-05 MED ORDER — BUPIVACAINE IN DEXTROSE 0.75-8.25 % IT SOLN
INTRATHECAL | Status: DC | PRN
  Administered 2018-11-05: 1.6 mL via INTRATHECAL

## 2018-11-05 MED ORDER — ENOXAPARIN SODIUM 40 MG/0.4ML ~~LOC~~ SOLN: 40.0000 mg | SUBCUTANEOUS | Status: DC

## 2018-11-05 MED ORDER — COCONUT OIL OIL: 1.0000 "application " | TOPICAL_OIL | Status: DC | PRN

## 2018-11-05 MED ORDER — DEXAMETHASONE SODIUM PHOSPHATE 4 MG/ML IJ SOLN
INTRAMUSCULAR | Status: DC | PRN
  Administered 2018-11-05: 4 mg via INTRAVENOUS

## 2018-11-05 MED ORDER — LACTATED RINGERS IV SOLN
INTRAVENOUS | Status: DC
  Administered 2018-11-05: 23:00:00 via INTRAVENOUS

## 2018-11-05 MED ORDER — PHENYLEPHRINE HCL-NACL 20-0.9 MG/250ML-% IV SOLN
INTRAVENOUS | Status: AC
  Filled 2018-11-05: qty 250

## 2018-11-05 MED ORDER — NALOXONE HCL 4 MG/10ML IJ SOLN
1.0000 ug/kg/h | INTRAVENOUS | Status: DC | PRN
  Filled 2018-11-05: qty 5

## 2018-11-05 MED ORDER — PHENYLEPHRINE HCL-NACL 20-0.9 MG/250ML-% IV SOLN
INTRAVENOUS | Status: DC | PRN
  Administered 2018-11-05: 60 ug/min via INTRAVENOUS

## 2018-11-05 MED ORDER — FENTANYL CITRATE (PF) 100 MCG/2ML IJ SOLN
INTRAMUSCULAR | Status: AC
  Filled 2018-11-05: qty 2

## 2018-11-05 MED ORDER — PHENYLEPHRINE HCL (PRESSORS) 10 MG/ML IV SOLN
INTRAVENOUS | Status: DC | PRN
  Administered 2018-11-05 (×2): 80 ug via INTRAVENOUS

## 2018-11-05 MED ORDER — SIMETHICONE 80 MG PO CHEW
80.0000 mg | CHEWABLE_TABLET | Freq: Three times a day (TID) | ORAL | Status: DC
  Administered 2018-11-05 – 2018-11-07 (×5): 80 mg via ORAL
  Filled 2018-11-05 (×5): qty 1

## 2018-11-05 MED ORDER — PHENYLEPHRINE 40 MCG/ML (10ML) SYRINGE FOR IV PUSH (FOR BLOOD PRESSURE SUPPORT)
PREFILLED_SYRINGE | INTRAVENOUS | Status: AC
  Filled 2018-11-05: qty 10

## 2018-11-05 MED ORDER — FENTANYL CITRATE (PF) 100 MCG/2ML IJ SOLN
INTRAMUSCULAR | Status: DC | PRN
  Administered 2018-11-05: 15 ug via INTRATHECAL

## 2018-11-05 MED ORDER — SENNOSIDES-DOCUSATE SODIUM 8.6-50 MG PO TABS: 2.0000 | ORAL_TABLET | Freq: Every evening | ORAL | Status: DC | PRN

## 2018-11-05 MED ORDER — TETANUS-DIPHTH-ACELL PERTUSSIS 5-2.5-18.5 LF-MCG/0.5 IM SUSP: 0.5000 mL | Freq: Once | INTRAMUSCULAR | Status: DC

## 2018-11-05 MED ORDER — ACETAMINOPHEN 500 MG PO TABS
1000.0000 mg | ORAL_TABLET | Freq: Three times a day (TID) | ORAL | Status: DC
  Administered 2018-11-05 – 2018-11-07 (×6): 1000 mg via ORAL
  Filled 2018-11-05 (×6): qty 2

## 2018-11-05 MED ORDER — ONDANSETRON HCL 4 MG/2ML IJ SOLN
INTRAMUSCULAR | Status: AC
  Filled 2018-11-05: qty 2

## 2018-11-05 MED ORDER — MORPHINE SULFATE (PF) 0.5 MG/ML IJ SOLN
INTRAMUSCULAR | Status: AC
  Filled 2018-11-05: qty 10

## 2018-11-05 MED ORDER — SOD CITRATE-CITRIC ACID 500-334 MG/5ML PO SOLN
ORAL | Status: AC
  Filled 2018-11-05: qty 15

## 2018-11-05 MED ORDER — MENTHOL 3 MG MT LOZG: 1.0000 | LOZENGE | OROMUCOSAL | Status: DC | PRN

## 2018-11-05 MED ORDER — SCOPOLAMINE 1 MG/3DAYS TD PT72
MEDICATED_PATCH | TRANSDERMAL | Status: AC
  Filled 2018-11-05: qty 1

## 2018-11-05 MED ORDER — STERILE WATER FOR IRRIGATION IR SOLN
Status: DC | PRN
  Administered 2018-11-05: 1

## 2018-11-05 MED ORDER — SODIUM CHLORIDE 0.9 % IV SOLN
INTRAVENOUS | Status: DC | PRN
  Administered 2018-11-05: 12:00:00 via INTRAVENOUS

## 2018-11-05 MED ORDER — DIBUCAINE (PERIANAL) 1 % EX OINT: 1.0000 "application " | TOPICAL_OINTMENT | CUTANEOUS | Status: DC | PRN

## 2018-11-05 MED ORDER — ENOXAPARIN SODIUM 60 MG/0.6ML ~~LOC~~ SOLN
50.0000 mg | SUBCUTANEOUS | Status: DC
  Administered 2018-11-06: 20:00:00 50 mg via SUBCUTANEOUS
  Filled 2018-11-05: qty 0.6

## 2018-11-05 MED ORDER — PRENATAL MULTIVITAMIN CH
1.0000 | ORAL_TABLET | Freq: Every day | ORAL | Status: DC
  Administered 2018-11-06 – 2018-11-07 (×2): 1 via ORAL
  Filled 2018-11-05 (×2): qty 1

## 2018-11-05 MED ORDER — NALBUPHINE HCL 10 MG/ML IJ SOLN: 5.0000 mg | Freq: Once | INTRAMUSCULAR | Status: DC | PRN

## 2018-11-05 MED ORDER — CEFAZOLIN SODIUM-DEXTROSE 2-4 GM/100ML-% IV SOLN
INTRAVENOUS | Status: AC
  Filled 2018-11-05: qty 100

## 2018-11-05 MED ORDER — OXYCODONE HCL 5 MG PO TABS: 5.0000 mg | ORAL_TABLET | ORAL | Status: DC | PRN

## 2018-11-05 MED ORDER — MORPHINE SULFATE (PF) 0.5 MG/ML IJ SOLN
INTRAMUSCULAR | Status: DC | PRN
  Administered 2018-11-05: 150 ug via INTRATHECAL

## 2018-11-05 MED ORDER — IBUPROFEN 800 MG PO TABS
800.0000 mg | ORAL_TABLET | Freq: Three times a day (TID) | ORAL | Status: DC
  Administered 2018-11-05 – 2018-11-07 (×6): 800 mg via ORAL
  Filled 2018-11-05 (×6): qty 1

## 2018-11-05 MED ORDER — NALOXONE HCL 0.4 MG/ML IJ SOLN: 0.4000 mg | INTRAMUSCULAR | Status: DC | PRN

## 2018-11-05 MED ORDER — SCOPOLAMINE 1 MG/3DAYS TD PT72: 1.0000 | MEDICATED_PATCH | Freq: Once | TRANSDERMAL | Status: DC

## 2018-11-05 MED ORDER — OXYTOCIN 40 UNITS IN NORMAL SALINE INFUSION - SIMPLE MED: 2.5000 [IU]/h | INTRAVENOUS | Status: AC

## 2018-11-05 MED ORDER — SCOPOLAMINE 1 MG/3DAYS TD PT72
MEDICATED_PATCH | TRANSDERMAL | Status: DC | PRN
  Administered 2018-11-05: 1 via TRANSDERMAL

## 2018-11-05 MED ORDER — DEXAMETHASONE SODIUM PHOSPHATE 4 MG/ML IJ SOLN
INTRAMUSCULAR | Status: AC
  Filled 2018-11-05: qty 7

## 2018-11-05 MED ORDER — CEFAZOLIN SODIUM-DEXTROSE 2-4 GM/100ML-% IV SOLN
2.0000 g | INTRAVENOUS | Status: AC
  Administered 2018-11-05: 11:00:00 2 g via INTRAVENOUS

## 2018-11-05 MED ORDER — OXYTOCIN 40 UNITS IN NORMAL SALINE INFUSION - SIMPLE MED
INTRAVENOUS | Status: AC
  Filled 2018-11-05: qty 1000

## 2018-11-05 MED ORDER — SODIUM CHLORIDE 0.9 % IR SOLN
Status: DC | PRN
  Administered 2018-11-05: 1

## 2018-11-05 MED ORDER — METOCLOPRAMIDE HCL 5 MG/ML IJ SOLN
INTRAMUSCULAR | Status: DC | PRN
  Administered 2018-11-05: 10 mg via INTRAVENOUS

## 2018-11-05 MED ORDER — ONDANSETRON HCL 4 MG/2ML IJ SOLN
INTRAMUSCULAR | Status: DC | PRN
  Administered 2018-11-05: 4 mg via INTRAVENOUS

## 2018-11-05 MED ORDER — SOD CITRATE-CITRIC ACID 500-334 MG/5ML PO SOLN
30.0000 mL | ORAL | Status: AC
  Administered 2018-11-05: 30 mL via ORAL

## 2018-11-05 MED ORDER — NALBUPHINE HCL 10 MG/ML IJ SOLN: 5.0000 mg | INTRAMUSCULAR | Status: DC | PRN

## 2018-11-05 MED ORDER — POLYETHYLENE GLYCOL 3350 17 G PO PACK
17.0000 g | PACK | Freq: Every day | ORAL | Status: DC
  Administered 2018-11-06 – 2018-11-07 (×2): 17 g via ORAL
  Filled 2018-11-05 (×2): qty 1

## 2018-11-05 MED ORDER — OXYTOCIN 10 UNIT/ML IJ SOLN
INTRAMUSCULAR | Status: DC | PRN
  Administered 2018-11-05: 40 [IU] via INTRAMUSCULAR

## 2018-11-05 MED ORDER — ONDANSETRON HCL 4 MG/2ML IJ SOLN: 4.0000 mg | Freq: Three times a day (TID) | INTRAMUSCULAR | Status: DC | PRN

## 2018-11-05 SURGICAL SUPPLY — 40 items
BENZOIN TINCTURE PRP APPL 2/3 (GAUZE/BANDAGES/DRESSINGS) ×2 IMPLANT
CANISTER SUCT 3000ML PPV (MISCELLANEOUS) ×2 IMPLANT
CHLORAPREP W/TINT 26ML (MISCELLANEOUS) ×2 IMPLANT
CLOSURE STERI STRIP 1/2 X4 (GAUZE/BANDAGES/DRESSINGS) ×1 IMPLANT
DRSG OPSITE POSTOP 4X10 (GAUZE/BANDAGES/DRESSINGS) ×2 IMPLANT
ELECT REM PT RETURN 9FT ADLT (ELECTROSURGICAL) ×2
ELECTRODE REM PT RTRN 9FT ADLT (ELECTROSURGICAL) ×1 IMPLANT
EXTRACTOR VACUUM KIWI (MISCELLANEOUS) ×2 IMPLANT
GAUZE SPONGE 4X4 12PLY STRL LF (GAUZE/BANDAGES/DRESSINGS) ×2 IMPLANT
GLOVE BIOGEL PI IND STRL 7.0 (GLOVE) ×2 IMPLANT
GLOVE BIOGEL PI IND STRL 7.5 (GLOVE) ×1 IMPLANT
GLOVE BIOGEL PI INDICATOR 7.0 (GLOVE) ×2
GLOVE BIOGEL PI INDICATOR 7.5 (GLOVE) ×1
GLOVE SKINSENSE NS SZ7.0 (GLOVE) ×1
GLOVE SKINSENSE STRL SZ7.0 (GLOVE) ×1 IMPLANT
GLOVE SURG SS PI 7.0 STRL IVOR (GLOVE) ×1 IMPLANT
GOWN STRL REUS W/ TWL LRG LVL3 (GOWN DISPOSABLE) ×2 IMPLANT
GOWN STRL REUS W/ TWL XL LVL3 (GOWN DISPOSABLE) ×1 IMPLANT
GOWN STRL REUS W/TWL LRG LVL3 (GOWN DISPOSABLE) ×2
GOWN STRL REUS W/TWL XL LVL3 (GOWN DISPOSABLE) ×1
NS IRRIG 1000ML POUR BTL (IV SOLUTION) ×2 IMPLANT
PACK C SECTION WH (CUSTOM PROCEDURE TRAY) ×2 IMPLANT
PAD ABD 7.5X8 STRL (GAUZE/BANDAGES/DRESSINGS) ×2 IMPLANT
PAD OB MATERNITY 4.3X12.25 (PERSONAL CARE ITEMS) ×2 IMPLANT
PAD PREP 24X48 CUFFED NSTRL (MISCELLANEOUS) ×2 IMPLANT
PENCIL SMOKE EVAC W/HOLSTER (ELECTROSURGICAL) ×2 IMPLANT
RETRACTOR WND ALEXIS 25 LRG (MISCELLANEOUS) IMPLANT
RTRCTR WOUND ALEXIS 25CM LRG (MISCELLANEOUS) ×2
SPONGE LAP 18X18 X RAY DECT (DISPOSABLE) ×2 IMPLANT
STRIP CLOSURE SKIN 1/2X4 (GAUZE/BANDAGES/DRESSINGS) ×2 IMPLANT
SUT CHROMIC 1 CTX 36 (SUTURE) ×4 IMPLANT
SUT MNCRL 0 VIOLET CTX 36 (SUTURE) ×2 IMPLANT
SUT MON AB 4-0 PS1 27 (SUTURE) ×2 IMPLANT
SUT MONOCRYL 0 CTX 36 (SUTURE) ×2
SUT PLAIN 2 0 XLH (SUTURE) ×2 IMPLANT
SUT VIC AB 0 CT1 36 (SUTURE) ×4 IMPLANT
SUT VIC AB 3-0 CT1 27 (SUTURE) ×1
SUT VIC AB 3-0 CT1 TAPERPNT 27 (SUTURE) ×1 IMPLANT
TOWEL OR 17X24 6PK STRL BLUE (TOWEL DISPOSABLE) ×4 IMPLANT
WATER STERILE IRR 1000ML POUR (IV SOLUTION) ×2 IMPLANT

## 2018-11-05 NOTE — Transfer of Care (Signed)
Immediate Anesthesia Transfer of Care Note  Patient: Laurie Sanders  Procedure(s) Performed: CESAREAN SECTION (N/A Abdomen)  Patient Location: PACU  Anesthesia Type:Spinal  Level of Consciousness: awake, alert  and oriented  Airway & Oxygen Therapy: Patient Spontanous Breathing  Post-op Assessment: Report given to RN and Post -op Vital signs reviewed and stable  Post vital signs: Reviewed and stable  Last Vitals:  Vitals Value Taken Time  BP 114/68 11/05/2018  1:12 PM  Temp    Pulse 83 11/05/2018  1:15 PM  Resp 16 11/05/2018  1:15 PM  SpO2 94 % 11/05/2018  1:15 PM  Vitals shown include unvalidated device data.  Last Pain:  Vitals:   11/05/18 0838  TempSrc: Oral      Patients Stated Pain Goal: 7 (11/05/18 4235)  Complications: No apparent anesthesia complications

## 2018-11-05 NOTE — H&P (Signed)
Obstetrics Admission History & Physical  11/05/2018 - 10:31 AM Primary OBGYN: [facility]  Chief Complaint: scheduled rpt c-section  History of Present Illness  23 y.o. [id number] @ [redacted]w[redacted]d, with the above CC. Pregnancy complicated by: h/o classical, h/o GC with neg TOC, h/o PTB on 17p this pregnancy.  [name] states that she is doing well and no s/s of labor and decreased FM  Review of Systems:  as noted in the History of Present Illness.  Patient Active Problem List   Diagnosis Date Noted  . History of cesarean section 11/05/2018  . Anemia during pregnancy in third trimester 10/06/2018  . History of cesarean section, classical 05/27/2018  . Gonorrhea in pregnancy, antepartum, first trimester 05/06/2018  . Hx of preterm delivery, currently pregnant, first trimester 04/30/2018  . Supervision of high risk pregnancy, antepartum 04/30/2018  . Intentional drug overdose (HCC)   . 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 Asthma with status asthmaticus 06/11/2012    PMHx:  Past Medical History:  Diagnosis Date  . Asthma   . Preterm premature rupture of membranes in second trimester 04/29/2011   PSHx:  Past Surgical History:  Procedure Laterality Date  . CESAREAN SECTION  05/09/2011   Procedure: CESAREAN SECTION;  Surgeon: Catalina Antigua, MD;  Location: [facility];  Service: Gynecology;  Laterality: N/A;   Medications:  Medications Prior to Admission  Medication Sig Dispense Refill Last Dose  . Prenatal Vit-Fe Phos-FA-Omega (VITAFOL GUMMIES) 3.33-0.333-34.8 MG CHEW Chew 3 each by mouth daily. (Patient taking differently: Chew 3 each by mouth at bedtime. ) 90 tablet 11 Past Week at Unknown time  . albuterol (PROVENTIL HFA;VENTOLIN HFA) 108 (90 BASE) MCG/ACT inhaler Inhale 2 puffs into the lungs every 4 (four) hours as needed for wheezing. 1 Inhaler 1 Unknown at Unknown time  . metoCLOPramide (REGLAN) 10 MG tablet Take 1 tablet (10 mg  total) by mouth every 6 (six) hours. (Patient not taking: Reported on 06/21/2018) 30 tablet 0 Not Taking     Allergies: is allergic to benadryl [diphenhydramine hcl] and kiwi extract. OBHx:  OB History  Gravida Para Term Preterm AB Living  2 1 0 1 0 1  SAB TAB Ectopic Multiple Live Births  0 0 0 0 1    # Outcome Date GA Lbr Len/2nd Weight Sex Delivery Anes PTL Lv  2 Current           1 Preterm 05/09/11 [redacted]w[redacted]d   F CS-LTranv Spinal  LIV    Obstetric Comments  G1- per op note, classical incision          FHx:  Family History  Problem Relation Age of Onset  . Diabetes Mother   . Asthma Mother   . Asthma Father   . Hypertension Father    Soc Hx:  Social History   Socioeconomic History  . Marital status: Single    Spouse name: Not on file  . Number of children: Not on file  . Years of education: Not on file  . Highest education level: Not on file  Occupational History  . Not on file  Social Needs  . Financial resource strain: Not hard at all  . Food insecurity:    Worry: Never true    Inability: Never true  . Transportation needs:    Medical: No    Non-medical: Not on file  Tobacco Use  . Smoking status: Light Tobacco Smoker    Packs/day: 0.25    Types:  Cigarettes  . Smokeless tobacco: Never Used  Substance and Sexual Activity  . Alcohol use: No  . Drug use: No  . Sexual activity: Yes    Birth control/protection: None    Comment: Patient has 1 yr old daughter  Lifestyle  . Physical activity:    Days per week: Not on file    Minutes per session: Not on file  . Stress: Not on file  Relationships  . Social connections:    Talks on phone: Not on file    Gets together: Not on file    Attends religious service: Not on file    Active member of club or organization: Not on file    Attends meetings of clubs or organizations: Not on file    Relationship status: Not on file  . Intimate partner violence:    Fear of current or ex partner: Not on file     Emotionally abused: Not on file    Physically abused: Not on file    Forced sexual activity: Not on file  Other Topics Concern  . Not on file  Social History Narrative  . Not on file    Objective    Current Vital Signs 24h Vital Sign Ranges  T 98.3 F (36.8 C) Temp  Avg: 98.3 F (36.8 C)  Min: 98.3 F (36.8 C)  Max: 98.3 F (36.8 C)  BP 106/66 BP  Min: 106/66  Max: 106/66  HR 91 Pulse  Avg: 91  Min: 91  Max: 91  RR 20 Resp  Avg: 20  Min: 20  Max: 20  SaO2 98 % Room Air SpO2  Avg: 98 %  Min: 98 %  Max: 98 %       24 Hour I/O Current Shift I/O  Time Ins Outs No intake/output data recorded. No intake/output data recorded.    General: Well nourished, well developed female in no acute distress.  Skin:  Warm and dry.  Cardiovascular: S1, S2 normal, no murmur, rub or gallop, regular rate and rhythm Respiratory:  Clear to auscultation bilateral. Normal respiratory effort Abdomen: gravid, nttp Neuro/Psych:  Normal mood and affect.   Labs  Recent Labs  Lab 11/05/18 0836  WBC 13.0*  HGB 10.8*  HCT 34.6*  PLT 326    Conflict (See Lab Report): O POS/O POS Performed at [facility], [address] [place], [place] [address]   Radiology Posterior placenta  Assessment & Plan   23 y.o. [id number] @ 4335w2d here for scheduled rpt c/s. Pt doing well *Pregnancy: routine care. Can proceed when OR is ready. Low fat diet PP due to gallstones this pregnancy   [name], Jr. MD Attending [facility](Faculty Practice)

## 2018-11-05 NOTE — Anesthesia Preprocedure Evaluation (Addendum)
Anesthesia Evaluation  Patient identified by MRN, date of birth, ID band Patient awake    Reviewed: Allergy & Precautions, H&P , NPO status , Patient's Chart, lab work & pertinent test results  History of Anesthesia Complications Negative for: history of anesthetic complications  Airway Mallampati: III  TM Distance: >3 FB Neck ROM: full    Dental no notable dental hx.    Pulmonary asthma , Current Smoker,    Pulmonary exam normal        Cardiovascular negative cardio ROS Normal cardiovascular exam     Neuro/Psych PSYCHIATRIC DISORDERS Depression negative neurological ROS     GI/Hepatic negative GI ROS, Neg liver ROS,   Endo/Other  negative endocrine ROS  Renal/GU negative Renal ROS  negative genitourinary   Musculoskeletal   Abdominal   Peds  Hematology negative hematology ROS (+)   Anesthesia Other Findings   Reproductive/Obstetrics (+) Pregnancy                            Anesthesia Physical Anesthesia Plan  ASA: II  Anesthesia Plan: Spinal   Post-op Pain Management:    Induction:   PONV Risk Score and Plan: Ondansetron and Treatment may vary due to age or medical condition  Airway Management Planned:   Additional Equipment:   Intra-op Plan:   Post-operative Plan:   Informed Consent: I have reviewed the patients History and Physical, chart, labs and discussed the procedure including the risks, benefits and alternatives for the proposed anesthesia with the patient or authorized representative who has indicated his/her understanding and acceptance.       Plan Discussed with:   Anesthesia Plan Comments:         Anesthesia Quick Evaluation

## 2018-11-05 NOTE — Anesthesia Procedure Notes (Signed)
Spinal  Patient location during procedure: OR Staffing Anesthesiologist: Addisen Chappelle E, MD Performed: anesthesiologist  Preanesthetic Checklist Completed: patient identified, surgical consent, pre-op evaluation, timeout performed, IV checked, risks and benefits discussed and monitors and equipment checked Spinal Block Patient position: sitting Prep: site prepped and draped and DuraPrep Patient monitoring: continuous pulse ox, blood pressure and heart rate Approach: midline Location: L3-4 Injection technique: single-shot Needle Needle type: Pencan  Needle gauge: 24 G Needle length: 9 cm Additional Notes Functioning IV was confirmed and monitors were applied. Sterile prep and drape, including hand hygiene and sterile gloves were used. The patient was positioned and the spine was prepped. The skin was anesthetized with lidocaine.  Free flow of clear CSF was obtained prior to injecting local anesthetic into the CSF. The needle was carefully withdrawn. The patient tolerated the procedure well.      

## 2018-11-05 NOTE — Op Note (Signed)
Operative Note   SURGERY DATE: 11/05/2018  PRE-OP DIAGNOSIS:  *Pregnancy at 37/2 *History of classical cesarean section  POST-OP DIAGNOSIS: Same. Delivered pelvic adhesive disease   PROCEDURE: repeat low transverse cesarean section via pfannenstiel skin incision with double layer uterine closure  SURGEON: Surgeon(s) and Role:    * Marathon Bing, MD - Primary  ASSISTANT:   Gwenevere Abbot, MD - Assisting  ANESTHESIA: spinal  ESTIMATED BLOOD LOSS:  DRAINS: UOP via indwelling foley  TOTAL IV FLUIDS: crystalloid  VTE PROPHYLAXIS: SCDs to bilateral lower extremities  ANTIBIOTICS: Two grams of Cefazolin were given., within 1 hour of skin incision  SPECIMENS: none  COMPLICATIONS: scar tissue (see below)  FINDINGS: thick adhesive band on the uterus, approximately 5-6cm above the hysterotomy at the mid, anterior fundal level. Grossly normal uterus, tubes and ovaries. Clear amniotic fluid, cephalic female infant, weight 3016gm, APGARs 8/9, intact placenta.  PROCEDURE IN DETAIL: The patient was taken to the operating room where anesthesia was administered and normal fetal heart tones were confirmed. She was then prepped and draped in the normal fashion in the dorsal supine position with a leftward tilt.  After a time out was performed, a pfannensteil skin incision was made with the scalpel and carried through to the underlying layer of fascia. The fascia was then incised at the midline and this incision was extended laterally with the mayo scissors. Attention was turned to the superior aspect of the fascial incision which was grasped with the kocher clamps x 2, tented up and the rectus muscles were dissected off with the scalpel. In a similar fashion the inferior aspect of the fascial incision was grasped with the kocher clamps, tented up and the rectus muscles dissected off with the mayo scissors. The rectus muscles were then separated in the midline and the peritoneum  was entered bluntly. The bladder blade was inserted and the vesicouterine peritoneum was identified, tented up and entered with the metzenbaum scissors. This incision was extended laterally and the bladder flap was created digitally. The alexis wound retractor was then placed around the above noted adhesion.   A low transverse hysterotomy was made with the scalpel until the endometrial cavity was breached and the amniotic sac ruptured with the Allis clamp, yielding clear amniotic fluid. This incision was extended bluntly and the infant's head, shoulders and body were delivered atraumatically.The cord was clamped x 2 and cut, and the infant was handed to the awaiting pediatricians, after delayed cord clamping was done.  The placenta was then gradually expressed from the uterus and then the uterus was exteriorized and cleared of all clots and debris. The hysterotomy was repaired with a running suture of 1-0 monocryl. A second imbricating layer of 1-0 monocryl suture was then placed. Several figure-of-eight sutures of #1 chromic were added to  Near the adhesion area achieve excellent hemostasis.   The uterus and adnexa were then returned to the abdomen, and the hysterotomy and all operative sites were reinspected and excellent hemostasis was noted after irrigation and suction of the abdomen with warm saline.  The fascia was reapproximated with 0 Vicryl in a simple running fashion bilaterally. The subcutaneous layer was then reapproximated with interrupted sutures of 2-0 plain gut, and the skin was then closed with 4-0 monocryl, in a subcuticular fashion.  The patient  tolerated the procedure well. Sponge, lap, needle, and instrument counts were correct x 2. The patient was transferred to the recovery room awake, alert and breathing independently in stable  condition.  Cornelia Copaharlie Calvina Liptak, Jr. MD Attending [facility](Faculty Practice)

## 2018-11-05 NOTE — Anesthesia Postprocedure Evaluation (Signed)
Anesthesia Post Note  Patient: Laurie Sanders  Procedure(s) Performed: CESAREAN SECTION (N/A Abdomen)     Patient location during evaluation: PACU Anesthesia Type: Spinal Level of consciousness: oriented and awake and alert Pain management: pain level controlled Vital Signs Assessment: post-procedure vital signs reviewed and stable Respiratory status: spontaneous breathing, respiratory function stable and nonlabored ventilation Cardiovascular status: blood pressure returned to baseline and stable Postop Assessment: no headache, no backache, no apparent nausea or vomiting and spinal receding Anesthetic complications: no    Last Vitals:  Vitals:   11/05/18 1400 11/05/18 1412  BP: 122/71 117/60  Pulse: 84 89  Resp: 12 19  Temp:  36.6 C  SpO2: 95% 95%    Last Pain:  Vitals:   11/05/18 1412  TempSrc: Oral  PainSc: 3    Pain Goal: Patients Stated Pain Goal: 7 (11/05/18 0838)                 Lucretia Kern

## 2018-11-06 LAB — CBC
HCT: 28.3 % — ABNORMAL LOW (ref 36.0–46.0)
Hemoglobin: 8.8 g/dL — ABNORMAL LOW (ref 12.0–15.0)
MCH: 31 pg (ref 26.0–34.0)
MCHC: 31.1 g/dL (ref 30.0–36.0)
MCV: 99.6 fL (ref 80.0–100.0)
Platelets: 299 10*3/uL (ref 150–400)
RBC: 2.84 MIL/uL — ABNORMAL LOW (ref 3.87–5.11)
RDW: 15.1 % (ref 11.5–15.5)
WBC: 16 10*3/uL — ABNORMAL HIGH (ref 4.0–10.5)
nRBC: 0 % (ref 0.0–0.2)

## 2018-11-06 LAB — CREATININE, SERUM
Creatinine, Ser: 0.83 mg/dL (ref 0.44–1.00)
GFR calc Af Amer: >60 mL/min (ref >60)
GFR calc non Af Amer: >60 mL/min (ref >60)

## 2018-11-06 LAB — RPR: RPR Ser Ql: NONREACTIVE

## 2018-11-06 MED ORDER — FERROUS SULFATE 325 (65 FE) MG PO TABS
325.0000 mg | ORAL_TABLET | Freq: Two times a day (BID) | ORAL | Status: DC
  Administered 2018-11-06 – 2018-11-07 (×3): 325 mg via ORAL
  Filled 2018-11-06 (×3): qty 1

## 2018-11-06 NOTE — Progress Notes (Signed)
POSTPARTUM PROGRESS NOTE  POD #1  Subjective:  Laurie Sanders is a 23 y.o. [id number] s/p rLTCS at [redacted]w[redacted]d.  She reports she doing well. No acute events overnight. She reports she is doing well. She denies any problems with ambulating, voiding or po intake. Denies nausea or vomiting. She has not passed flatus, put is feeling gas pains. Pain is well controlled.  Lochia is mild.  Objective: Blood pressure 123/69, pulse 75, temperature 98.2 F (36.8 C), temperature source Oral, resp. rate 18, height 5\' 5"  (1.651 m), weight 106.6 kg, last menstrual period 02/17/2018, SpO2 96 %, unknown if currently breastfeeding.  Physical Exam:  General: alert, cooperative and no distress Chest: no respiratory distress Heart:regular rate, distal pulses intact Abdomen: soft, nontender, +BS, non-distended  Uterine Fundus: firm, appropriately tender DVT Evaluation: No calf swelling or tenderness Extremities: No LE edema Skin: warm, dry; incision clean/dry/intact w/ pressure dressing in place  Recent Labs    11/05/18 0836 11/06/18 0745  HGB 10.8* 8.8*  HCT 34.6* 28.3*    Assessment/Plan: Laurie Sanders is a 23 y.o. [id number] s/p rLTCS at [redacted]w[redacted]d.  POD#1 - Doing welll; pain well controlled. H/H appropriate  Routine postpartum care  OOB, ambulated  Lovenox for VTE prophylaxis Anemia: asymptomatic  Start po ferrous sulfate BID Contraception: Depo Feeding: Bottle   Dispo: Plan for discharge POD#2 or #3.   LOS: 1 day   Marcy Siren, D.O. OB Fellow  11/06/2018, 8:47 AM

## 2018-11-07 DIAGNOSIS — D62 Acute posthemorrhagic anemia: Secondary | ICD-10-CM | POA: Diagnosis not present

## 2018-11-07 MED ORDER — IBUPROFEN 800 MG PO TABS: 800.0000 mg | ORAL_TABLET | Freq: Three times a day (TID) | ORAL | 0 refills | Status: DC

## 2018-11-07 MED ORDER — OXYCODONE-ACETAMINOPHEN 5-325 MG PO TABS: 1.0000 | ORAL_TABLET | ORAL | 0 refills | Status: DC | PRN

## 2018-11-07 MED ORDER — MEDROXYPROGESTERONE ACETATE 150 MG/ML IM SUSP
150.0000 mg | Freq: Once | INTRAMUSCULAR | Status: AC
  Administered 2018-11-07: 150 mg via INTRAMUSCULAR
  Filled 2018-11-07 (×2): qty 1

## 2018-11-07 NOTE — Discharge Summary (Signed)
Postpartum Discharge Summary     Patient Name: Laurie Sanders DOB: [birth year] MRN: [id number]  Date of admission: 11/05/2018 Delivering Provider: [place] BingPICKENS, CHARLIE   Date of discharge: 11/07/2018  Admitting diagnosis: RCS PREVIOUS CLASSICAL Intrauterine pregnancy: 455w2d     Secondary diagnosis:  Active Problems:   Anemia during pregnancy in third trimester   History of cesarean section   Cholelithiasis affecting pregnancy in third trimester, antepartum   Pelvic adhesive disease   Postoperative anemia due to acute blood loss   Cesarean delivery, delivered, current hospitalization  Additional problems: None     Discharge diagnosis: Term Pregnancy Delivered and Anemia                                                                                                Post partum procedures: None  Augmentation: None  Complications: None  Hospital course:  Scheduled C/S   23 y.o. yo [id number] at 575w2d was admitted to the hospital 11/05/2018 for scheduled cesarean section with the following indication:Prior Uterine Surgery.  Membrane Rupture Time/Date: 11:57 AM ,11/05/2018   Patient delivered a Viable infant.11/05/2018  Details of operation can be found in separate operative note.  Pateint had an uncomplicated postpartum course.  She is ambulating, tolerating a regular diet, passing flatus, and urinating well. Patient is discharged home in stable condition on  11/07/18         Magnesium Sulfate recieved: No BMZ received: No  Physical exam  Vitals:   11/06/18 1417 11/06/18 1502 11/06/18 2143 11/07/18 0656  BP: (!) 84/53 (!) 98/52 106/61 111/64  Pulse: 67 71 80 81  Resp: 17  17 18   Temp: 98.4 F (36.9 C)  98.5 F (36.9 C) 98.5 F (36.9 C)  TempSrc: Oral  Oral Oral  SpO2: 100%   100%  Weight:      Height:       General: alert, cooperative and no distress Lochia: appropriate Uterine Fundus: firm Incision: Healing well with no significant drainage, No significant erythema,  Dressing is clean, dry, and intact DVT Evaluation: No evidence of DVT seen on physical exam. Labs: Lab Results  Component Value Date   WBC 16.0 (H) 11/06/2018   HGB 8.8 (L) 11/06/2018   HCT 28.3 (L) 11/06/2018   MCV 99.6 11/06/2018   PLT 299 11/06/2018   CMP Latest Ref Rng & Units 11/06/2018  Glucose 70 - 99 mg/dL -  BUN 6 - 20 mg/dL -  Creatinine 4.740.44 - 2.591.00 mg/dL 5.630.83  Sodium 875135 - 643145 mmol/L -  Potassium 3.5 - 5.1 mmol/L -  Chloride 98 - 111 mmol/L -  CO2 22 - 32 mmol/L -  Calcium 8.9 - 10.3 mg/dL -  Total Protein 6.5 - 8.1 g/dL -  Total Bilirubin 0.3 - 1.2 mg/dL -  Alkaline Phos 38 - 329126 U/L -  AST 15 - 41 U/L -  ALT 0 - 44 U/L -    Discharge instruction: per After Visit Summary and "Baby and Me Booklet".  After visit meds:  Allergies as of 11/07/2018      Reactions   Benadryl [diphenhydramine Hcl]  Hives   Kiwi Extract Swelling   Lips swell      Medication List    STOP taking these medications   metoCLOPramide 10 MG tablet Commonly known as:  REGLAN     TAKE these medications   albuterol 108 (90 Base) MCG/ACT inhaler Commonly known as:  PROVENTIL HFA;VENTOLIN HFA Inhale 2 puffs into the lungs every 4 (four) hours as needed for wheezing.   ibuprofen 800 MG tablet Commonly known as:  ADVIL,MOTRIN Take 1 tablet (800 mg total) by mouth every 8 (eight) hours.   oxyCODONE-acetaminophen 5-325 MG tablet Commonly known as:  Percocet Take 1 tablet by mouth every 4 (four) hours as needed for severe pain.   Vitafol Gummies 3.33-0.333-34.8 MG Chew Chew 3 each by mouth daily. What changed:  when to take this       Diet: routine diet  Activity: Advance as tolerated. Pelvic rest for 6 weeks.   Outpatient follow up:2 weeks Follow up Appt:No future appointments. Follow up Visit: Please schedule this patient for Postpartum visit in: 2 weeks with the following provider: Any provider For C/S patients schedule nurse incision check in weeks 2 weeks: yes Low risk  pregnancy complicated by: hx of classical cesarean section Delivery mode:  CS Anticipated Birth Control:  Depo PP Procedures needed: Incision check  Schedule Integrated BH visit: no  Newborn Data: Live born female  Birth Weight: 6 lb 10.4 oz (3016 g) APGAR: 8, 9  Newborn Delivery   Birth date/time:  11/05/2018 12:00:00 Delivery type:  C-Section, Vacuum Assisted Trial of labor:  No C-section categorization:  Repeat     Baby Feeding: Bottle Disposition:home with mother   11/07/2018 Gwenevere Abbot, MD

## 2018-11-07 NOTE — Progress Notes (Signed)
CSW received consult for MOB due to history of depression. CSW spoke with MOB to discuss her mental health history and assess for needs. MOB confirmed her history of depression but stated it originated a long time ago, approximately seven years ago. MOB reports that she deals with her symptoms by smoking a cigarette. CSW offered other ways to cope with symptoms including going outside for fresh air, deep breathing, or another hobby that is healthy, MOB expressed understanding. MOB reports that she is not on any psychotropic medications and does not believe any are necessary. MOB reports this is her second child, her oldest being seven years old. MOB stated she does not know if she had postpartum depression with her first child due to her being so young at time of delivery. MOB denies any past or current thoughts of self harm or harming others. MOB does not have a therapist. MOB reports she has a great support system outside of the hospital. CSW provided MOB with information for [place] [facility]. CSW encouraged MOB to be vigilant with her self assessments for her mood and to reach out for assistance if any need or concern arises, MOB agreeable.  Laurie Sanders, MSW, LCSW-A Clinical Social Worker [facility] [phone]   

## 2018-11-18 ENCOUNTER — Telehealth: Payer: Self-pay | Admitting: Obstetrics & Gynecology

## 2018-11-18 NOTE — Telephone Encounter (Signed)
Called the patient to inform of upcoming doctor appointments. Left the patient a voicemail stating to call our office with upcoming appointment information. Also sending the patient a message via mychart. Scheduled the depo injection on the day of the incision check because the patient will be at the clinic. Scheduling the postpartum visit for a virtual.

## 2018-11-22 ENCOUNTER — Ambulatory Visit: Payer: Self-pay

## 2018-12-16 ENCOUNTER — Encounter: Payer: Self-pay | Admitting: Obstetrics and Gynecology

## 2018-12-16 ENCOUNTER — Other Ambulatory Visit: Payer: Self-pay

## 2018-12-16 ENCOUNTER — Telehealth (INDEPENDENT_AMBULATORY_CARE_PROVIDER_SITE_OTHER): Payer: Medicaid Other | Admitting: Obstetrics and Gynecology

## 2018-12-16 DIAGNOSIS — Z1379 Encounter for other screening for genetic and chromosomal anomalies: Secondary | ICD-10-CM | POA: Diagnosis not present

## 2018-12-16 NOTE — Progress Notes (Signed)
TELEHEALTH POSTPARTUM VIRTUAL VIDEO VISIT ENCOUNTER NOTE  Provider location: Center for [employer] at [facility]   I connected with Laurie Sanders on 12/16/18 at  3:15 PM EDT by Telephone Encounter at home and verified that I am speaking with the correct person using two identifiers.    I discussed the limitations, risks, security and privacy concerns of performing an evaluation and management service by telephone and the availability of in person appointments. I also discussed with the patient that there may be a patient responsible charge related to this service. The patient expressed understanding and agreed to proceed.  Appointment Date: 12/16/2018  OBGYN Clinic: [facility]  Chief Complaint: Postpartum Visit  History of Present Illness: Laurie Sanders is a 23 y.o. African-American G2P1102 being evaluated for postpartum followup.    She is s/p repeat cesarean section on April 10th at 37.2 weeks; she was discharged to home on April 12th. Pregnancy complicated by Cholestasis. Baby is doing well.  Complains of None. Resumed intercourse and would like STD testing.   Vaginal bleeding or discharge: No  Intercourse: Yes  Contraception: Depo-Provera Mode of feeding infant: Bottle PP depression s/s: No .  Any bowel or bladder issues: No  Pap smear: no abnormalities (date: 04/30/18)  Review of Systems:  Her 12 point review of systems is negative or as noted in the History of Present Illness.  Patient Active Problem List   Diagnosis Date Noted  . Postpartum exam 12/16/2018  . Postoperative anemia due to acute blood loss 11/07/2018  . Cesarean delivery, delivered, current hospitalization 11/07/2018  . History of cesarean section 11/05/2018  . Cholelithiasis affecting pregnancy in third trimester, antepartum 11/05/2018  . 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 Anemia during pregnancy in third trimester 10/06/2018  . History of cesarean section, classical 05/27/2018  .  Gonorrhea in pregnancy, antepartum, first trimester 05/06/2018  . Hx of preterm delivery, currently pregnant, first trimester 04/30/2018  . Supervision of high risk pregnancy, antepartum 04/30/2018  . Intentional drug overdose (HCC)   . 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 Asthma with status asthmaticus 06/11/2012    Medications Laurie Sanders had no medications administered during this visit. Current Outpatient Medications  Medication Sig Dispense Refill  . albuterol (PROVENTIL HFA;VENTOLIN HFA) 108 (90 BASE) MCG/ACT inhaler Inhale 2 puffs into the lungs every 4 (four) hours as needed for wheezing. 1 Inhaler 1  . Prenatal Vit-Fe Phos-FA-Omega (VITAFOL GUMMIES) 3.33-0.333-34.8 MG CHEW Chew 3 each by mouth daily. (Patient taking differently: Chew 3 each by mouth at bedtime. ) 90 tablet 11   No current facility-administered medications for this visit.     Allergies Benadryl [diphenhydramine hcl] and Kiwi extract  Physical Exam:  Breastfeeding No   General:  Alert, oriented and cooperative. Patient is in no acute distress.  Mental Status: Normal mood and affect. Normal behavior. Normal judgment and thought content.   Respiratory: Normal respiratory effort noted, no problems with respiration noted  Rest of physical exam deferred due to type of encounter  PP Depression Screening:   Edinburgh Postnatal Depression Scale - 12/16/18 1522      Edinburgh Postnatal Depression Scale:  In the Past 7 Days   I have been able to laugh and see the funny side of things.  0    I have looked forward with enjoyment to things.  0    I have blamed myself unnecessarily when things went wrong.  2    I have been  anxious or worried for no good reason.  0    I have felt scared or panicky for no good reason.  0    Things have been getting on top of me.  0    I have been so unhappy that I have had difficulty sleeping.  0    I have felt sad or  miserable.  0    I have been so unhappy that I have been crying.  0    The thought of harming myself has occurred to me.  0    Edinburgh Postnatal Depression Scale Total  2       Assessment:Patient is a 23 y.o. [id number] who is 5 weeks postpartum from a repeat cesarean section.  She is doing well.  She did not come in for an incision check. She would like to come in ASAP for STD testing.   Plan: Return tomorrow for RN visit: STD screening, BP check, and incision check. Return in Sept for annual exam  I discussed the assessment and treatment plan with the patient. The patient was provided an opportunity to ask questions and all were answered. The patient agreed with the plan and demonstrated an understanding of the instructions.   The patient was advised to call back or seek an in-person evaluation/go to the ED for any concerning postpartum symptoms.  I provided 10 minutes of face-to-face time during this encounter.   Venia Carbon, NP Center for [employer], [facility]

## 2018-12-17 ENCOUNTER — Other Ambulatory Visit: Payer: Self-pay

## 2018-12-17 ENCOUNTER — Other Ambulatory Visit (HOSPITAL_COMMUNITY)
Admission: RE | Admit: 2018-12-17 | Discharge: 2018-12-17 | Disposition: A | Discharge status: Home / Self Care | Payer: Medicaid Other | Source: Ambulatory Visit | Attending: Family Medicine | Admitting: Family Medicine

## 2018-12-17 ENCOUNTER — Ambulatory Visit (INDEPENDENT_AMBULATORY_CARE_PROVIDER_SITE_OTHER): Payer: Medicaid Other

## 2018-12-17 DIAGNOSIS — A64 Unspecified sexually transmitted disease: Secondary | ICD-10-CM | POA: Diagnosis not present

## 2018-12-17 DIAGNOSIS — Z5189 Encounter for other specified aftercare: Secondary | ICD-10-CM

## 2018-12-17 NOTE — Progress Notes (Signed)
I have reviewed the chart and agree with nursing staff's documentation of this patient's encounter.  Blong Busk, MD 12/17/2018 1:25 PM    

## 2018-12-17 NOTE — Progress Notes (Signed)
Pt here today for incision check.  Incision is well approximated, no odor, no drainage, and no erythema.  Pt denies any pain or bleeding.  Pt reports that she would like to be tested for STI.  Pt explained how to obtain self swab and that we will call her with abnormal results.  Pt stated understanding with no further questions.   Addison Naegeli, RN 12/17/18

## 2018-12-21 ENCOUNTER — Telehealth: Payer: Self-pay | Admitting: Student

## 2018-12-21 LAB — CERVICOVAGINAL ANCILLARY ONLY
Bacterial vaginitis: POSITIVE — AB
Candida vaginitis: NEGATIVE
Chlamydia: NEGATIVE
Neisseria Gonorrhea: POSITIVE — AB
Trichomonas: NEGATIVE

## 2018-12-21 NOTE — Telephone Encounter (Signed)
Called Cytology to f/u on pt's results. Was informed that the pt's results should be resulted by the end of the day.  LM for pt that her results have not resulted due to the holiday weekend and that we will call her with abnormal results.  If she continues to have questions to please give the office a call.

## 2018-12-21 NOTE — Telephone Encounter (Signed)
The patient stated she has called a few times for her lab results and unable to reach anyone. Would like a call back asap.

## 2018-12-22 ENCOUNTER — Telehealth: Payer: Self-pay | Admitting: *Deleted

## 2018-12-22 ENCOUNTER — Encounter: Payer: Self-pay | Admitting: Obstetrics & Gynecology

## 2018-12-22 ENCOUNTER — Other Ambulatory Visit: Payer: Self-pay | Admitting: Obstetrics & Gynecology

## 2018-12-22 DIAGNOSIS — A549 Gonococcal infection, unspecified: Secondary | ICD-10-CM | POA: Insufficient documentation

## 2018-12-22 DIAGNOSIS — B9689 Other specified bacterial agents as the cause of diseases classified elsewhere: Secondary | ICD-10-CM

## 2018-12-22 HISTORY — DX: Gonococcal infection, unspecified: A54.9

## 2018-12-22 MED ORDER — CEFTRIAXONE SODIUM 250 MG IJ SOLR: 250.0000 mg | Freq: Once | INTRAMUSCULAR | Status: DC

## 2018-12-22 MED ORDER — AZITHROMYCIN 500 MG PO TABS: 1000.0000 mg | ORAL_TABLET | Freq: Once | ORAL | 1 refills | Status: AC

## 2018-12-22 MED ORDER — METRONIDAZOLE 500 MG PO TABS: 500.0000 mg | ORAL_TABLET | Freq: Two times a day (BID) | ORAL | 0 refills | Status: DC

## 2018-12-22 NOTE — Telephone Encounter (Signed)
-----   Message from Tereso Newcomer, MD sent at 12/22/2018 10:44 AM EDT ----- Patient has gonorrhea again.  Recommend testing for other STIs, also needs to let partner(s) know so the partner(s) can get testing and treatment. Patient and sex partner(s) should abstain from unprotected sexual activity for two weeks after everyone receives appropriate treatment.  Needs to come in for Ceftriaxone injection, oral Azithromycin prescribed. Incidentally, patient also has bacterial vaginosis, Metronidazole prescribed.    Patient will need to return in about 4 weeks after treatment for repeat test of cure.  Please call to inform patient of results and recommendations, and advise to pick up prescriptions and take as directed. She also needs to come in for the injection as recommended.  Of note, result and this result note were released to MyChart.   Jaynie Collins, MD

## 2018-12-22 NOTE — Telephone Encounter (Signed)
Called pt and informed her of +GC results and need for treatment. Pt stated she was aware because her partner told her he tested positive and was treated last week. She has not had sex with him since his treatment. Pt understands she needs to wait 2 full weeks after her treatment before having sex with any partner. Pt agreed to appointment in office tomorrow @ [phone].

## 2018-12-22 NOTE — Progress Notes (Signed)
Lab Result Note  Patient has gonorrhea again.  Recommend testing for other STIs, also needs to let partner(s) know so the partner(s) can get testing and treatment. Patient and sex partner(s) should abstain from unprotected sexual activity for two weeks after everyone receives appropriate treatment.  Needs to come in for Ceftriaxone injection, oral Azithromycin prescribed. Incidentally, patient also has bacterial vaginosis, Metronidazole prescribed.    Patient will need to return in about 4 weeks after treatment for repeat test of cure.  Please call to inform patient of results and recommendations, and advise to pick up prescriptions and take as directed. She also needs to come in for the injection as recommended.  Of note, result and this result note were released to MyChart.   Jaynie Collins, MD

## 2018-12-23 ENCOUNTER — Other Ambulatory Visit: Payer: Self-pay

## 2018-12-23 ENCOUNTER — Ambulatory Visit (INDEPENDENT_AMBULATORY_CARE_PROVIDER_SITE_OTHER): Payer: Medicaid Other | Admitting: Lactation Services

## 2018-12-23 DIAGNOSIS — A549 Gonococcal infection, unspecified: Secondary | ICD-10-CM

## 2018-12-23 MED ORDER — CEFTRIAXONE SODIUM 250 MG IJ SOLR
250.0000 mg | Freq: Once | INTRAMUSCULAR | Status: AC
  Administered 2018-12-23: 10:00:00 250 mg via INTRAMUSCULAR

## 2018-12-23 MED ORDER — AZITHROMYCIN 500 MG PO TABS
1000.0000 mg | ORAL_TABLET | Freq: Once | ORAL | Status: AC
  Administered 2018-12-23: 10:00:00 1000 mg via ORAL

## 2018-12-23 NOTE — Progress Notes (Addendum)
Pt in for treatment for Gonorrhea. Medications given, infant tolerated well.   Pt counseled to refrain from sexual intercourse for the next 2 weeks. Pt reports her partner has been treated within the last 2 weeks and that they have not engaged in Sexual intercourse since.   Pt in return in 4 weeks for TOC per provider.   STD Card faxed to [facility] 12/28/18.   Noralee Stain, RN BSN, [employer]

## 2018-12-23 NOTE — Progress Notes (Signed)
I have reviewed this chart and agree with the RN/CMA assessment and management.    K. Meryl Davis, M.D. Attending [facility] (Faculty Practice)   

## 2019-01-04 ENCOUNTER — Encounter: Payer: Self-pay | Admitting: *Deleted

## 2019-01-24 ENCOUNTER — Ambulatory Visit: Payer: Medicaid Other

## 2019-01-27 ENCOUNTER — Ambulatory Visit: Payer: Medicaid Other

## 2019-01-27 ENCOUNTER — Telehealth: Payer: Self-pay | Admitting: Family Medicine

## 2019-01-27 NOTE — Telephone Encounter (Signed)
Spoke with patient about her appointment on 7/2 @ 10:20. Patient was instructed to wear a face mask and no visitors are allowed with her. Patient was screened for covid symptoms and denied having any. Patient stated that she is very tired and would like to rescheduled for Monday or Tuesday when she is off. Patient was rescheduled to 7/7 @ 10:20

## 2019-02-01 ENCOUNTER — Ambulatory Visit (INDEPENDENT_AMBULATORY_CARE_PROVIDER_SITE_OTHER): Payer: Medicaid Other | Admitting: General Practice

## 2019-02-01 ENCOUNTER — Other Ambulatory Visit (HOSPITAL_COMMUNITY)
Admission: RE | Admit: 2019-02-01 | Discharge: 2019-02-01 | Disposition: A | Discharge status: Home / Self Care | Payer: Medicaid Other | Source: Ambulatory Visit | Attending: Obstetrics and Gynecology | Admitting: Obstetrics and Gynecology

## 2019-02-01 ENCOUNTER — Other Ambulatory Visit: Payer: Self-pay

## 2019-02-01 VITALS — BP 111/74 | HR 74 | Ht 65.0 in | Wt 214.0 lb

## 2019-02-01 DIAGNOSIS — Z8619 Personal history of other infectious and parasitic diseases: Secondary | ICD-10-CM | POA: Diagnosis not present

## 2019-02-01 DIAGNOSIS — Z3042 Encounter for surveillance of injectable contraceptive: Secondary | ICD-10-CM | POA: Diagnosis not present

## 2019-02-01 MED ORDER — MEDROXYPROGESTERONE ACETATE 150 MG/ML IM SUSP
150.0000 mg | Freq: Once | INTRAMUSCULAR | Status: AC
  Administered 2019-02-01: 150 mg via INTRAMUSCULAR

## 2019-02-01 NOTE — Progress Notes (Signed)
Laurie Sanders here for Depo-Provera  Injection.  Injection administered without complication. per chart review, patient had positive gonorrhea test in May- will do TOC today. Patient instructed in self swab and specimen collected. Patient will return in 3 months for next injection.  Derinda Late, RN 02/01/2019  10:27 AM

## 2019-02-02 LAB — GC/CHLAMYDIA PROBE AMP (~~LOC~~) NOT AT ARMC
Chlamydia: NEGATIVE
Neisseria Gonorrhea: NEGATIVE

## 2019-02-04 NOTE — Progress Notes (Signed)
Chart reviewed for nurse visit. Agree with plan of care.   Kooistra, Kathryn Lorraine, CNM 02/04/2019 4:12 PM   

## 2019-04-19 ENCOUNTER — Ambulatory Visit: Payer: Medicaid Other

## 2019-04-27 ENCOUNTER — Ambulatory Visit: Payer: Medicaid Other

## 2019-05-02 ENCOUNTER — Telehealth: Payer: Self-pay | Admitting: Family Medicine

## 2019-05-02 NOTE — Telephone Encounter (Signed)
Attempted to call patient with her depo injection appointment 10/7 @ 1:30. No answer, left voicemail with the appointment information. Patient instructed to give the office a call back if needing to reschedule.

## 2019-05-04 ENCOUNTER — Other Ambulatory Visit (HOSPITAL_COMMUNITY)
Admission: RE | Admit: 2019-05-04 | Discharge: 2019-05-04 | Disposition: A | Discharge status: Home / Self Care | Payer: Medicaid Other | Source: Ambulatory Visit | Attending: Obstetrics and Gynecology | Admitting: Obstetrics and Gynecology

## 2019-05-04 ENCOUNTER — Other Ambulatory Visit: Payer: Self-pay

## 2019-05-04 ENCOUNTER — Ambulatory Visit (INDEPENDENT_AMBULATORY_CARE_PROVIDER_SITE_OTHER): Payer: Medicaid Other

## 2019-05-04 VITALS — BP 112/67 | HR 75 | Wt 205.4 lb

## 2019-05-04 DIAGNOSIS — Z3042 Encounter for surveillance of injectable contraceptive: Secondary | ICD-10-CM | POA: Diagnosis not present

## 2019-05-04 MED ORDER — MEDROXYPROGESTERONE ACETATE 150 MG/ML IM SUSP
150.0000 mg | Freq: Once | INTRAMUSCULAR | Status: AC
  Administered 2019-05-04: 150 mg via INTRAMUSCULAR

## 2019-05-04 NOTE — Progress Notes (Signed)
Laurie Sanders here for Depo-Provera Injection. Pt received prior injection on 02/01/19; today's injection was to be given 9/22-10/6. Pt reports she has not had unprotected intercourse in 7 days. Okay to give injection today per Ilda Basset, MD. Injection administered without complication. Patient will return in 3 months for next injection.  Pt states she would like to do a self-swab today for STDs. Pt denies any symptoms. Self-swab instructions given and specimen obtained. Notified pt we will contact her if results are abnormal. Pt verbalizes understanding.  Annabell Howells, RN 05/04/2019  3:18 PM

## 2019-05-09 NOTE — Progress Notes (Signed)
Patient seen and assessed by nursing staff during this encounter. I have reviewed the chart and agree with the documentation and plan.  Aletha Halim, MD 05/09/2019 2:41 PM

## 2019-05-12 ENCOUNTER — Telehealth: Payer: Self-pay | Admitting: Lactation Services

## 2019-05-12 DIAGNOSIS — B9689 Other specified bacterial agents as the cause of diseases classified elsewhere: Secondary | ICD-10-CM

## 2019-05-12 DIAGNOSIS — N760 Acute vaginitis: Secondary | ICD-10-CM

## 2019-05-12 LAB — CERVICOVAGINAL ANCILLARY ONLY
Bacterial Vaginitis (gardnerella): POSITIVE — AB
Candida Glabrata: NEGATIVE
Candida Vaginitis: NEGATIVE
Chlamydia: NEGATIVE
Comment: NEGATIVE
Comment: NEGATIVE
Comment: NEGATIVE
Comment: NEGATIVE
Comment: NEGATIVE
Comment: NORMAL
Neisseria Gonorrhea: NEGATIVE
Trichomonas: NEGATIVE

## 2019-05-12 MED ORDER — METRONIDAZOLE 500 MG PO TABS: 500.0000 mg | ORAL_TABLET | Freq: Two times a day (BID) | ORAL | 0 refills | Status: DC

## 2019-05-12 NOTE — Telephone Encounter (Signed)
Called pt to let her know that her vaginal swab was positive for BV which is not an STD. Pt informed Prescription for Flagyl sent to Pharmacy. Pt informed not to drink alcohol while taking. Pt voiced understanding. Pt to let us know if she does not feel better after treatment.

## 2019-06-04 ENCOUNTER — Other Ambulatory Visit: Payer: Self-pay

## 2019-06-04 DIAGNOSIS — S63501A Unspecified sprain of right wrist, initial encounter: Secondary | ICD-10-CM | POA: Diagnosis not present

## 2019-06-04 DIAGNOSIS — Y939 Activity, unspecified: Secondary | ICD-10-CM | POA: Insufficient documentation

## 2019-06-04 DIAGNOSIS — S6991XA Unspecified injury of right wrist, hand and finger(s), initial encounter: Secondary | ICD-10-CM | POA: Diagnosis present

## 2019-06-04 DIAGNOSIS — Y9263 Factory as the place of occurrence of the external cause: Secondary | ICD-10-CM | POA: Insufficient documentation

## 2019-06-04 DIAGNOSIS — F17210 Nicotine dependence, cigarettes, uncomplicated: Secondary | ICD-10-CM | POA: Insufficient documentation

## 2019-06-04 DIAGNOSIS — J45909 Unspecified asthma, uncomplicated: Secondary | ICD-10-CM | POA: Diagnosis not present

## 2019-06-04 DIAGNOSIS — X58XXXA Exposure to other specified factors, initial encounter: Secondary | ICD-10-CM | POA: Diagnosis not present

## 2019-06-04 DIAGNOSIS — Z79899 Other long term (current) drug therapy: Secondary | ICD-10-CM | POA: Insufficient documentation

## 2019-06-04 DIAGNOSIS — Y999 Unspecified external cause status: Secondary | ICD-10-CM | POA: Diagnosis not present

## 2019-06-05 ENCOUNTER — Emergency Department (HOSPITAL_COMMUNITY): Payer: Medicaid Other

## 2019-06-05 ENCOUNTER — Other Ambulatory Visit: Payer: Self-pay

## 2019-06-05 ENCOUNTER — Emergency Department (HOSPITAL_COMMUNITY)
Admission: EM | Admit: 2019-06-05 | Discharge: 2019-06-05 | Disposition: A | Discharge status: Home / Self Care | Payer: Medicaid Other | Attending: Emergency Medicine | Admitting: Emergency Medicine

## 2019-06-05 DIAGNOSIS — S63501A Unspecified sprain of right wrist, initial encounter: Secondary | ICD-10-CM

## 2019-06-05 DIAGNOSIS — M25531 Pain in right wrist: Secondary | ICD-10-CM

## 2019-06-05 MED ORDER — IBUPROFEN 800 MG PO TABS
800.0000 mg | ORAL_TABLET | Freq: Once | ORAL | Status: AC
  Administered 2019-06-05: 800 mg via ORAL
  Filled 2019-06-05: qty 1

## 2019-06-05 MED ORDER — DICLOFENAC SODIUM 75 MG PO TBEC: 75.0000 mg | DELAYED_RELEASE_TABLET | Freq: Two times a day (BID) | ORAL | 0 refills | Status: AC

## 2019-06-05 NOTE — Discharge Instructions (Signed)
1. Medications: Voltaren for pain control, usual home medications 2. Treatment: rest, ice, elevate and use brace, drink plenty of fluids, gentle stretching 3. Follow Up: Please followup with orthopedics as directed or your PCP in 1 week if no improvement for discussion of your diagnoses and further evaluation after today's visit; if you do not have a primary care doctor use the resource guide provided to find one; Please return to the ER for worsening symptoms or other concerns

## 2019-06-05 NOTE — ED Notes (Signed)
Patient verbalizes understanding of discharge instructions. Opportunity for questioning and answers were provided. Armband removed by staff, pt discharged from ED. Pt. ambulatory and discharged home.  

## 2019-06-05 NOTE — ED Provider Notes (Signed)
[facility] EMERGENCY DEPARTMENT Provider Note   CSN: [id number] Arrival date & time: 06/04/19  2347     History   Chief Complaint Chief Complaint  Patient presents with  . Wrist Pain    HPI Laurie Sanders is a 23 y.o. female with no major medical problems presents to the Emergency Department complaining of gradual, persistent, progressively worsening right wrist pain onset last night while at work.  Pt reports she was working in [employer] when she had a sharp pain the in the ulnar region of her wrist that radiated into her right little finger. Pt reports no numbness or tingling.  After work, she bought a wrist splint which helped the pain some, but she continued to have discomfort.  No other treatments PTA.  Pt denies previous injury or recurrent pain.  Movement and palpation make her pain worse.      The history is provided by the patient and medical records. No language interpreter was used.    Past Medical History:  Diagnosis Date  . Asthma   . Gonorrhea 12/22/2018  . Preterm premature rupture of membranes in second trimester 04/29/2011    Patient Active Problem List   Diagnosis Date Noted  . Gonorrhea 12/22/2018  . 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 Intentional drug overdose (HCC)   . 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 Asthma with status asthmaticus 06/11/2012    Past Surgical History:  Procedure Laterality Date  . CESAREAN SECTION  05/09/2011   Procedure: CESAREAN SECTION;  Surgeon: Catalina Antigua, MD;  Location: [facility] ORS;  Service: Gynecology;  Laterality: N/A;  . CESAREAN SECTION N/A 11/05/2018   Procedure: CESAREAN SECTION;  Surgeon: Waconia Bing, MD;  Location: [facility] LD ORS;  Service: Obstetrics;  Laterality: N/A;     OB History    Gravida  2   Para  2   Term  1   Preterm  1   AB  0   Living  2     SAB  0   TAB  0   Ectopic  0   Multiple  0   Live Births  2         Obstetric Comments  G1- per op note, classical incision         Home Medications    Prior to Admission medications   Medication Sig Start Date End Date Taking? Authorizing Provider  albuterol (PROVENTIL HFA;VENTOLIN HFA) 108 (90 BASE) MCG/ACT inhaler Inhale 2 puffs into the lungs every 4 (four) hours as needed for wheezing. 06/13/12   Cioffredi, Candelaria Stagers, MD  diclofenac (VOLTAREN) 75 MG EC tablet Take 1 tablet (75 mg total) by mouth 2 (two) times daily for 15 days. 06/05/19 06/20/19  Khalib Fendley, Dahlia Client, PA-C  metroNIDAZOLE (FLAGYL) 500 MG tablet Take 1 tablet (500 mg total) by mouth 2 (two) times daily. 12/22/18   Anyanwu, Jethro Bastos, MD  metroNIDAZOLE (FLAGYL) 500 MG tablet Take 1 tablet (500 mg total) by mouth 2 (two) times daily. 05/12/19   Reva Bores, MD  Prenatal Vit-Fe Phos-FA-Omega (VITAFOL GUMMIES) 3.33-0.333-34.8 MG CHEW Chew 3 each by mouth daily. Patient taking differently: Chew 3 each by mouth at bedtime.  09/30/18   Currie Paris, NP    Family History Family History  Problem Relation Age of Onset  . Diabetes Mother   . Asthma Mother   . Asthma Father   . Hypertension Father     Social History Social  History   Tobacco Use  . Smoking status: Light Tobacco Smoker    Packs/day: 0.25    Types: Cigarettes  . Smokeless tobacco: Never Used  Substance Use Topics  . Alcohol use: No  . Drug use: No     Allergies   Benadryl [diphenhydramine hcl] and Kiwi extract   Review of Systems Review of Systems  Constitutional: Negative for fever.  Musculoskeletal: Positive for arthralgias and joint swelling.  Neurological: Negative for numbness.     Physical Exam Updated Vital Signs BP 115/71   Pulse 73   Temp 98.2 F (36.8 C) (Oral)   Resp 18   SpO2 100%   Physical Exam Vitals signs and nursing note reviewed.  Constitutional:      General: She is not in acute distress.    Appearance: She is well-developed.  HENT:     Head: Normocephalic.   Eyes:     General: No scleral icterus.    Conjunctiva/sclera: Conjunctivae normal.  Neck:     Musculoskeletal: Normal range of motion.  Cardiovascular:     Rate and Rhythm: Normal rate.  Pulmonary:     Effort: Pulmonary effort is normal.  Musculoskeletal:     Comments: Full ROM of the right elbow and all fingers on the right hand.  Slightly decreased ROM in the right wrist due to pain. No open wounds, visible or palpable deformity.   Skin:    General: Skin is warm and dry.  Neurological:     Mental Status: She is alert.     Comments: Sensation intact throughout the RUE. Strength 5/5 at the right elbow and with flexion extension of all fingers on the right hand.  4/5 at the right wrist due to pain.       ED Treatments / Results   Radiology Dg Wrist Complete Right  Result Date: 06/05/2019 CLINICAL DATA:  Left wrist pain EXAM: RIGHT WRIST - COMPLETE 3+ VIEW COMPARISON:  None. FINDINGS: There is no evidence of fracture or dislocation. There is no evidence of arthropathy or other focal bone abnormality. Soft tissues are unremarkable. IMPRESSION: Has Dr. 8 L Electronically Signed   By: Jonna ClarkBindu  Avutu M.D.   On: 06/05/2019 01:47    Procedures Procedures (including critical care time)  Medications Ordered in ED Medications  ibuprofen (ADVIL) tablet 800 mg (800 mg Oral Given 06/05/19 0044)     Initial Impression / Assessment and Plan / ED Course  I have reviewed the triage vital signs and the nursing notes.  Pertinent labs & imaging results that were available during my care of the patient were reviewed by me and considered in my medical decision making (see chart for details).  Clinical Course as of Jun 04 201  Wynelle LinkSun Jun 05, 2019  0159 No evidence of fracture.  I personally evaluated the films.  DG Wrist Complete Right [HM]    Clinical Course User Index [HM] Kamilah Correia, Dahlia ClientHannah, PA-C       Patient X-Ray negative for obvious fracture or dislocation. Pain managed in ED. Pt  advised to follow up with orthopedics if symptoms persist for possibility of missed fracture diagnosis vs ligamentous injury. Patient given brace while in ED, conservative therapy recommended and discussed. Patient will be dc home & is agreeable with above plan.   Final Clinical Impressions(s) / ED Diagnoses   Final diagnoses:  Right wrist pain  Sprain of right wrist, initial encounter    ED Discharge Orders         Ordered  diclofenac (VOLTAREN) 75 MG EC tablet  2 times daily     06/05/19 0200           Prakriti Carignan, Gwenlyn Perking 06/05/19 0202    Ward, Delice Bison, DO 06/05/19 [phone]

## 2019-06-05 NOTE — ED Triage Notes (Signed)
Patient c/o left wrist pain that began last night. States she works at a [employer].

## 2019-07-19 ENCOUNTER — Ambulatory Visit: Payer: Medicaid Other

## 2019-11-02 ENCOUNTER — Other Ambulatory Visit: Payer: Self-pay

## 2019-11-02 ENCOUNTER — Other Ambulatory Visit (HOSPITAL_COMMUNITY)
Admission: RE | Admit: 2019-11-02 | Discharge: 2019-11-02 | Disposition: A | Discharge status: Home / Self Care | Payer: Medicaid Other | Source: Ambulatory Visit | Attending: Obstetrics and Gynecology | Admitting: Obstetrics and Gynecology

## 2019-11-02 ENCOUNTER — Ambulatory Visit (INDEPENDENT_AMBULATORY_CARE_PROVIDER_SITE_OTHER): Payer: Medicaid Other

## 2019-11-02 VITALS — BP 118/82 | HR 89 | Wt 214.3 lb

## 2019-11-02 DIAGNOSIS — Z3042 Encounter for surveillance of injectable contraceptive: Secondary | ICD-10-CM

## 2019-11-02 DIAGNOSIS — Z3202 Encounter for pregnancy test, result negative: Secondary | ICD-10-CM | POA: Diagnosis not present

## 2019-11-02 DIAGNOSIS — Z202 Contact with and (suspected) exposure to infections with a predominantly sexual mode of transmission: Secondary | ICD-10-CM | POA: Insufficient documentation

## 2019-11-02 LAB — POCT PREGNANCY, URINE: Preg Test, Ur: NEGATIVE

## 2019-11-02 MED ORDER — MEDROXYPROGESTERONE ACETATE 150 MG/ML IM SUSP
150.0000 mg | Freq: Once | INTRAMUSCULAR | Status: AC
  Administered 2019-11-02: 150 mg via INTRAMUSCULAR

## 2019-11-02 NOTE — Progress Notes (Signed)
Agree with A & P. 

## 2019-11-02 NOTE — Progress Notes (Signed)
Laurie Sanders here for Depo-Provera  Injection.  Injection administered without complication. Patient will return in 3 months for next injection.  Pt explained how to obtain self swab and that we will call with abnormal results.  Pt verbalized understanding.   Ralene Bathe, RN 11/02/2019  9:08 AM

## 2019-11-04 LAB — CERVICOVAGINAL ANCILLARY ONLY
Bacterial Vaginitis (gardnerella): NEGATIVE
Candida Glabrata: NEGATIVE
Candida Vaginitis: NEGATIVE
Chlamydia: NEGATIVE
Comment: NEGATIVE
Comment: NEGATIVE
Comment: NEGATIVE
Comment: NEGATIVE
Comment: NEGATIVE
Comment: NORMAL
Neisseria Gonorrhea: NEGATIVE
Trichomonas: NEGATIVE

## 2020-01-18 ENCOUNTER — Ambulatory Visit (INDEPENDENT_AMBULATORY_CARE_PROVIDER_SITE_OTHER): Payer: Medicaid Other | Admitting: Lactation Services

## 2020-01-18 ENCOUNTER — Other Ambulatory Visit: Payer: Self-pay

## 2020-01-18 VITALS — BP 109/69 | HR 75 | Ht 64.5 in | Wt 206.5 lb

## 2020-01-18 DIAGNOSIS — Z3042 Encounter for surveillance of injectable contraceptive: Secondary | ICD-10-CM

## 2020-01-18 MED ORDER — MEDROXYPROGESTERONE ACETATE 150 MG/ML IM SUSP
150.0000 mg | Freq: Once | INTRAMUSCULAR | Status: AC
  Administered 2020-01-18: 150 mg via INTRAMUSCULAR

## 2020-01-18 NOTE — Progress Notes (Signed)
Laurie Sanders here for Depo-Provera  Injection.  Injection administered without complication. Patient will return in 3 months for next injection. Patient tolerated well. Patient reports mild spotting for the last week, most likely due to time for dosing. She has no other concerns at this time.   Ed Blalock, RN 01/18/2020  9:22 AM

## 2020-01-18 NOTE — Progress Notes (Signed)
Chart reviewed - agree with CMA/RN documentation.  ° °

## 2020-02-02 IMAGING — US ULTRASOUND ABDOMEN LIMITED
1 series · 15 of 25 positions shown · non-contrast
Comparison: None.

CLINICAL DATA: Right upper quadrant abdominal pain during
pregnancy. Patient is 33 weeks 3 days pregnant.

EXAM:
ULTRASOUND ABDOMEN LIMITED RIGHT UPPER QUADRANT

[Series 1: ultrasound abdomen limited · 15 of 50 slices shown]
[im 1/50]
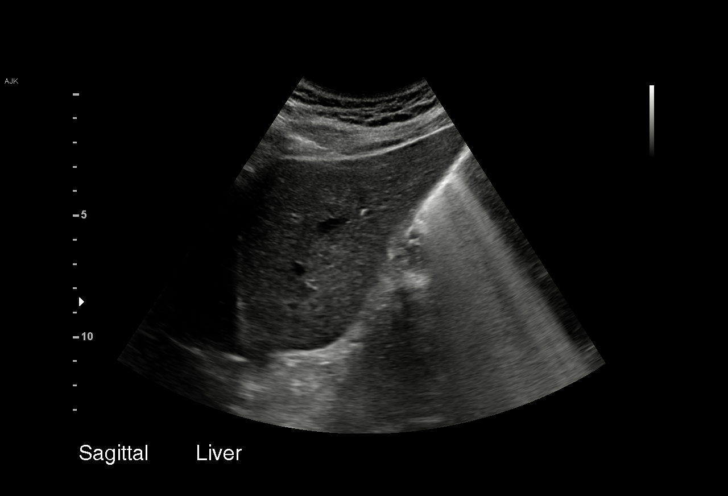
[im 5/50]
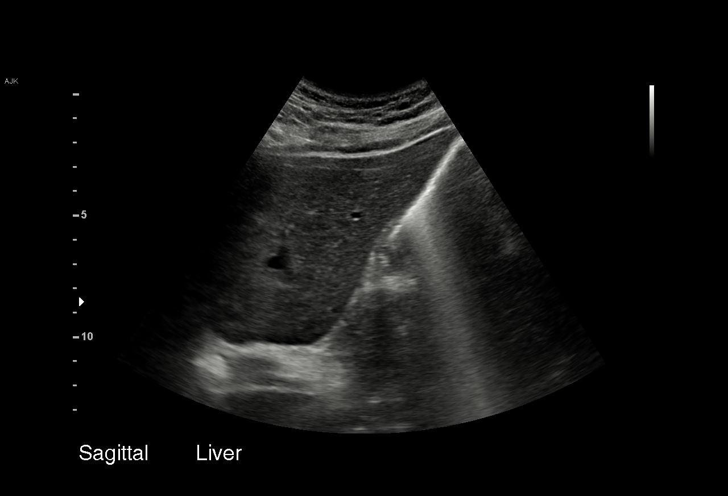
[im 9/50]
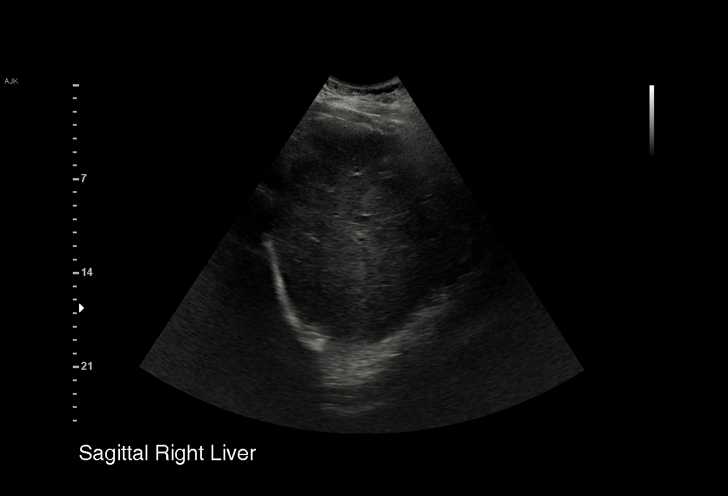
[im 11/50]
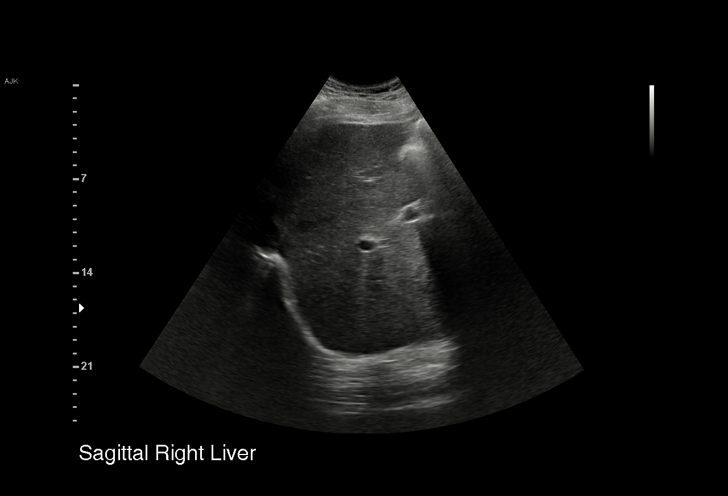
[im 15/50]
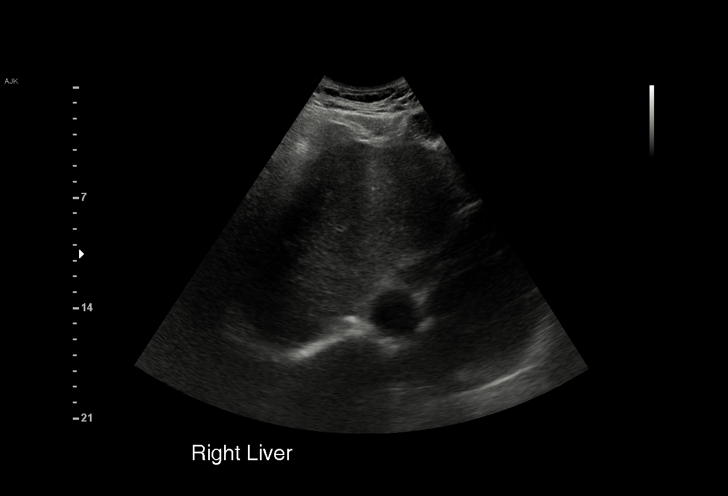
[im 19/50]
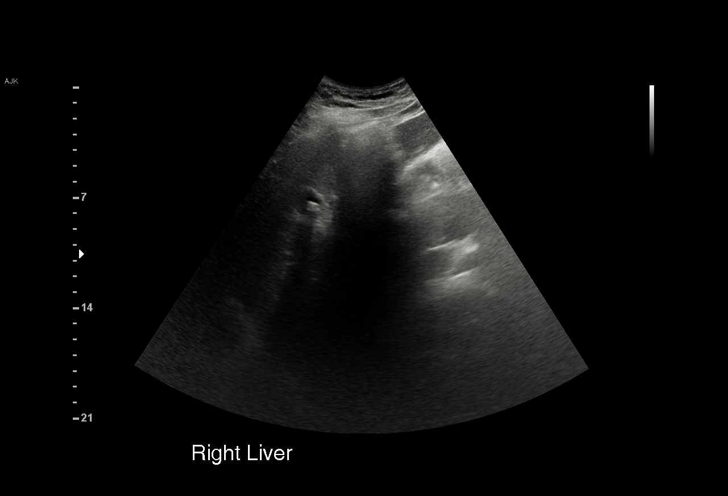
[im 21/50]
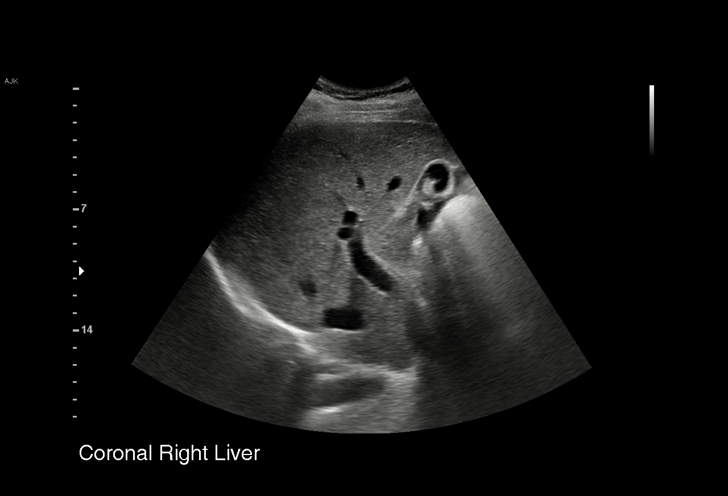
[im 25/50]
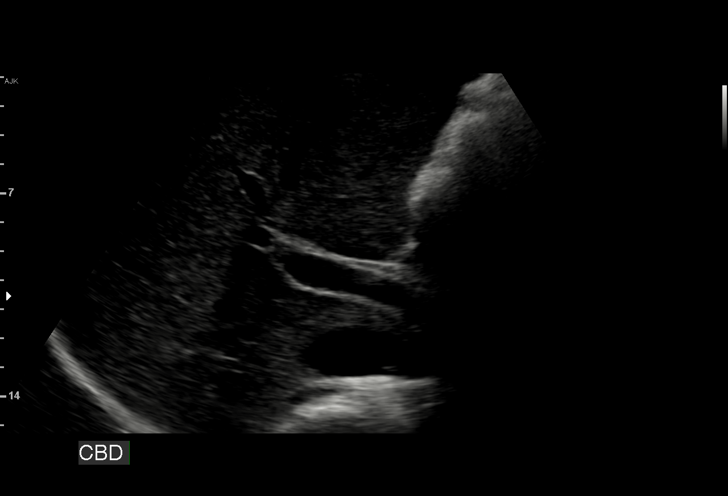
[im 29/50]
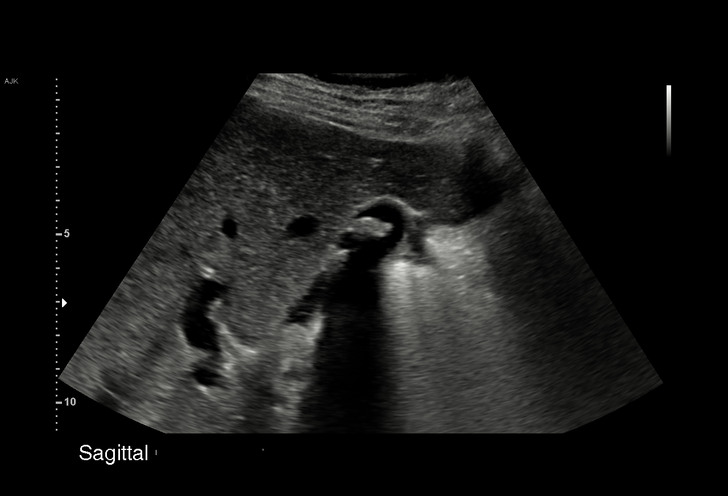
[im 31/50]
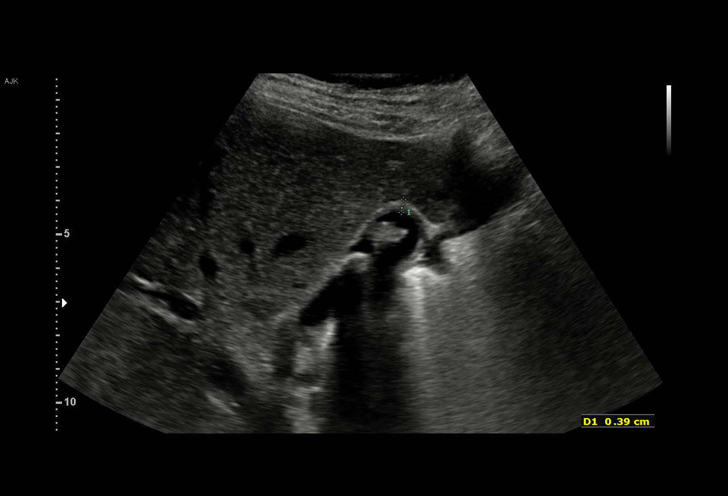
[im 35/50]
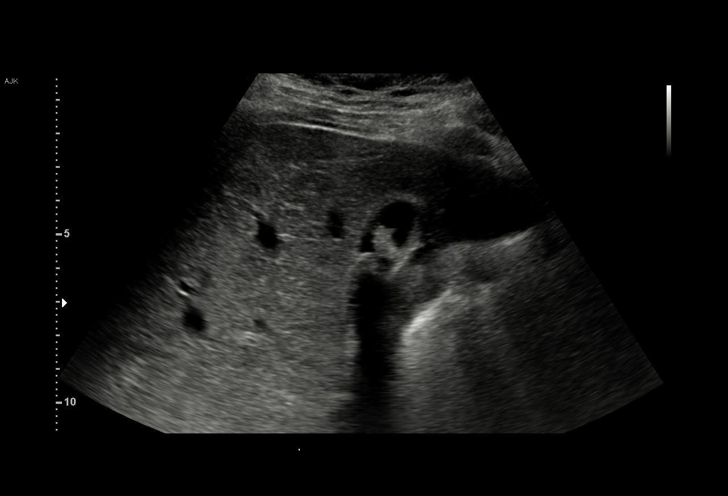
[im 39/50]
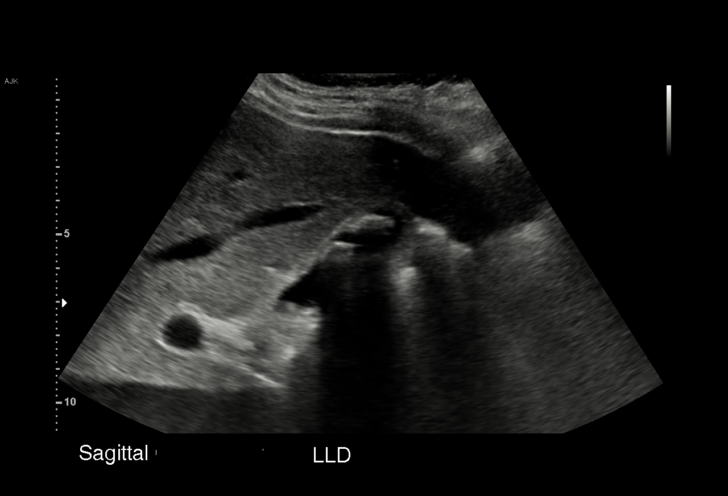
[im 41/50]
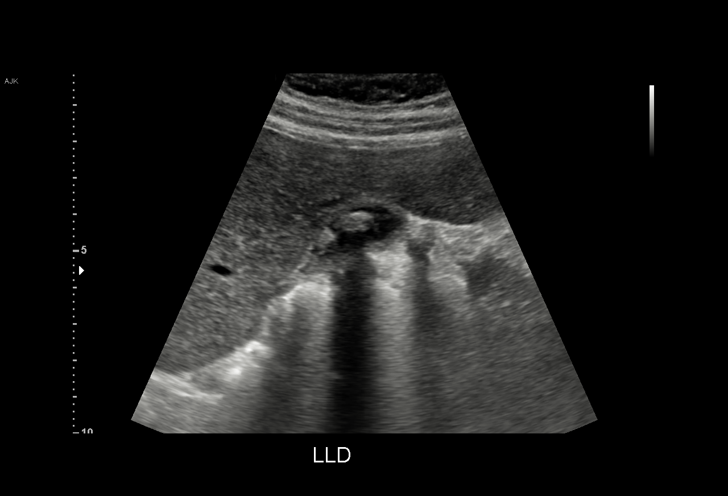
[im 45/50]
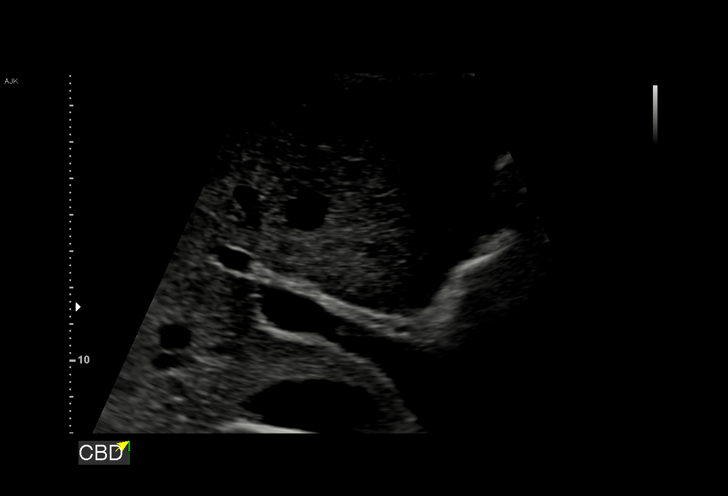
[im 50/50]
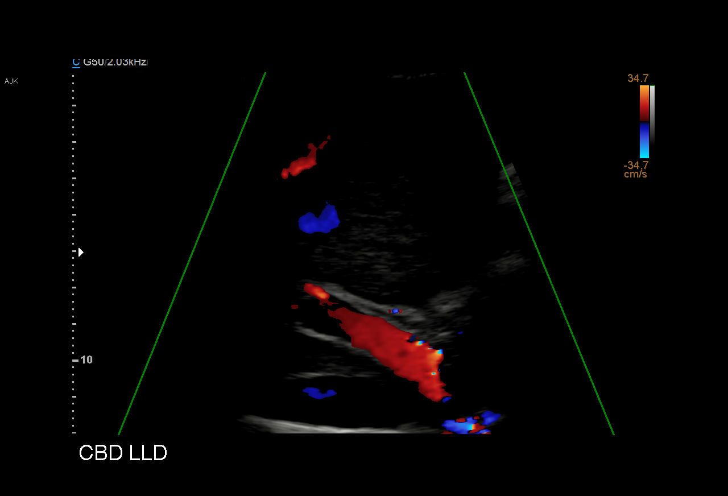

[15 of 25 positions shown; findings below may reference images not displayed]

FINDINGS: Gallbladder:

Cholelithiasis with several shadowing stones in the gallbladder
measuring up to 1.4 cm in diameter. Mild gallbladder wall thickening
at 3.9 mm. No edema. Murphy's sign is negative.

Common bile duct:

Diameter: 2.4 mm, normal

Liver:

No focal lesion identified. Within normal limits in parenchymal
echogenicity. Portal vein is patent on color Doppler imaging with
normal direction of blood flow towards the liver.
IMPRESSION: Cholelithiasis and mild gallbladder wall thickening. Murphy's sign
is negative.

## 2020-04-05 ENCOUNTER — Ambulatory Visit: Payer: Medicaid Other | Admitting: Nurse Practitioner

## 2020-04-24 ENCOUNTER — Ambulatory Visit (INDEPENDENT_AMBULATORY_CARE_PROVIDER_SITE_OTHER): Payer: Medicaid Other | Admitting: Nurse Practitioner

## 2020-04-24 ENCOUNTER — Encounter: Payer: Self-pay | Admitting: Nurse Practitioner

## 2020-04-24 ENCOUNTER — Other Ambulatory Visit: Payer: Self-pay

## 2020-04-24 VITALS — BP 105/66 | HR 76 | Ht 64.0 in | Wt 213.8 lb

## 2020-04-24 DIAGNOSIS — Z6836 Body mass index (BMI) 36.0-36.9, adult: Secondary | ICD-10-CM

## 2020-04-24 DIAGNOSIS — F17200 Nicotine dependence, unspecified, uncomplicated: Secondary | ICD-10-CM | POA: Diagnosis not present

## 2020-04-24 DIAGNOSIS — Z3042 Encounter for surveillance of injectable contraceptive: Secondary | ICD-10-CM | POA: Diagnosis not present

## 2020-04-24 DIAGNOSIS — Z7189 Other specified counseling: Secondary | ICD-10-CM

## 2020-04-24 DIAGNOSIS — Z01419 Encounter for gynecological examination (general) (routine) without abnormal findings: Secondary | ICD-10-CM

## 2020-04-24 MED ORDER — MEDROXYPROGESTERONE ACETATE 150 MG/ML IM SUSP
150.0000 mg | Freq: Once | INTRAMUSCULAR | Status: AC
  Administered 2020-04-24: 150 mg via INTRAMUSCULAR

## 2020-04-24 NOTE — Progress Notes (Signed)
GYNECOLOGY ANNUAL PREVENTATIVE CARE ENCOUNTER NOTE  Subjective:   Laurie Sanders is a 24 y.o. G40P1102 female here for a routine annual gynecologic exam.  Current complaints: annual exam and depo shot.   Denies abnormal vaginal bleeding, discharge, pelvic pain, problems with intercourse or other gynecologic concerns.    Gynecologic History Patient's last menstrual period was 12/27/2019 (approximate). Contraception: Depo-Provera injections Last Pap: 04-2018. Results were: normal   Obstetric History OB History  Gravida Para Term Preterm AB Living  2 2 1 1  0 2  SAB TAB Ectopic Multiple Live Births  0 0 0 0 2    # Outcome Date GA Lbr Len/2nd Weight Sex Delivery Anes PTL Lv  2 Term 11/05/18 [redacted]w[redacted]d  6 lb 10.4 oz (3.016 kg) M CS-Vac Spinal  LIV  1 Preterm 05/09/11 [redacted]w[redacted]d   F CS-Classical Spinal  LIV    Obstetric Comments  G1- per op note, classical incision    Past Medical History:  Diagnosis Date   Asthma    Gonorrhea 12/22/2018   Preterm premature rupture of membranes in second trimester 04/29/2011    Past Surgical History:  Procedure Laterality Date   CESAREAN SECTION  05/09/2011   Procedure: CESAREAN SECTION;  Surgeon: 07/09/2011, MD;  Location: [facility]S;  Service: Gynecology;  Laterality: N/A;   CESAREAN SECTION N/A 11/05/2018   Procedure: CESAREAN SECTION;  Surgeon: 01/05/2019, MD;  Location: [facility] LD ORS;  Service: Obstetrics;  Laterality: N/A;    Current Outpatient Medications on File Prior to Visit  Medication Sig Dispense Refill   albuterol (PROVENTIL HFA;VENTOLIN HFA) 108 (90 BASE) MCG/ACT inhaler Inhale 2 puffs into the lungs every 4 (four) hours as needed for wheezing. (Patient not taking: Reported on 01/18/2020) 1 Inhaler 1   metroNIDAZOLE (FLAGYL) 500 MG tablet Take 1 tablet (500 mg total) by mouth 2 (two) times daily. (Patient not taking: Reported on 01/18/2020) 14 tablet 0   metroNIDAZOLE (FLAGYL) 500 MG tablet Take 1 tablet (500 mg total) by mouth  2 (two) times daily. (Patient not taking: Reported on 01/18/2020) 14 tablet 0   Prenatal Vit-Fe Phos-FA-Omega (VITAFOL GUMMIES) 3.33-0.333-34.8 MG CHEW Chew 3 each by mouth daily. (Patient not taking: Reported on 01/18/2020) 90 tablet 11   Current Facility-Administered Medications on File Prior to Visit  Medication Dose Route Frequency Provider Last Rate Last Admin   cefTRIAXone (ROCEPHIN) injection 250 mg  250 mg Intramuscular Once Anyanwu, 01/20/2020, MD        Allergies  Allergen Reactions   Benadryl [Diphenhydramine Hcl] Hives   Kiwi Extract Swelling    Lips swell    Social History   Socioeconomic History   Marital status: Single    Spouse name: Not on file   Number of children: Not on file   Years of education: Not on file   Highest education level: Not on file  Occupational History   Not on file  Tobacco Use   Smoking status: Light Tobacco Smoker    Packs/day: 0.25    Types: Cigarettes   Smokeless tobacco: Never Used  Vaping Use   Vaping Use: Never used  Substance and Sexual Activity   Alcohol use: No   Drug use: No   Sexual activity: Yes    Birth control/protection: None    Comment: Patient has 73 yr old daughter  Other Topics Concern   Not on file  Social History Narrative   Not on file   Social Determinants of 2  Strain:    Difficulty of Paying Living Expenses: Not on file  Food Insecurity: No Food Insecurity   Worried About Running Out of Food in the Last Year: Never true   Ran Out of Food in the Last Year: Never true  Transportation Needs: No Transportation Needs   Lack of Transportation (Medical): No   Lack of Transportation (Non-Medical): No  Physical Activity:    Days of Exercise per Week: Not on file   Minutes of Exercise per Session: Not on file  Stress:    Feeling of Stress : Not on file  Social Connections:    Frequency of Communication with Friends and Family: Not on file   Frequency of  Social Gatherings with Friends and Family: Not on file   Attends Religious Services: Not on file   Active Member of Clubs or Organizations: Not on file   Attends Banker Meetings: Not on file   Marital Status: Not on file  Intimate Partner Violence:    Fear of Current or Ex-Partner: Not on file   Emotionally Abused: Not on file   Physically Abused: Not on file   Sexually Abused: Not on file    Family History  Problem Relation Age of Onset   Diabetes Mother    Asthma Mother    Asthma Father    Hypertension Father     The following portions of the patient's history were reviewed and updated as appropriate: allergies, current medications, past family history, past medical history, past social history, past surgical history and problem list.  Review of Systems Pertinent items noted in HPI and remainder of comprehensive ROS otherwise negative.   Objective:  BP 105/66    Pulse 76    Ht 5\' 4"  (1.626 m)    Wt 213 lb 12.8 oz (97 kg)    LMP 12/27/2019 (Approximate)    BMI 36.70 kg/m  CONSTITUTIONAL: Well-developed, well-nourished female in no acute distress.  HENT:  Normocephalic, atraumatic, External right and left ear normal.  EYES: Conjunctivae and EOM are normal. Pupils are equal, round.  No scleral icterus.  NECK: Normal range of motion, supple, no masses.  Normal thyroid.  SKIN: Skin is warm and dry. No rash noted. Not diaphoretic. No erythema. No pallor. NEUROLOGIC: Alert and oriented to person, place, and time. Normal reflexes, muscle tone coordination. No cranial nerve deficit noted. PSYCHIATRIC: Normal mood and affect. Normal behavior. Normal judgment and thought content. CARDIOVASCULAR: Normal heart rate noted, regular rhythm RESPIRATORY: Clear to auscultation bilaterally. Effort and breath sounds normal, no problems with respiration noted. BREASTS: Symmetric in size. No masses, skin changes, nipple drainage, or lymphadenopathy. ABDOMEN: Soft, no  distention noted.  No tenderness, rebound or guarding.  PELVIC: Normal appearing external genitalia; normal appearing vaginal mucosa and cervix.  No abnormal discharge noted.  Pap smear obtained.  Normal uterine size, no other palpable masses, no uterine or adnexal tenderness. MUSCULOSKELETAL: Normal range of motion. No tenderness.  No cyanosis, clubbing, or edema.    Assessment and Plan:  1. Women's annual routine gynecological examination  2. BMI 36.0-36.9,adult Weight loss strategies discussed  3. Surveillance for Depo-Provera contraception Depo given today and return in 3 months  4.  Current smoker Discussed smoking cessation - info given in AVS Client readiness increasing but wants to focus on weight loss for now  5.  covid vaccine counseling COVID-19 Vaccine Counseling: The patient was counseled on the potential benefits and lack of known risks of COVID vaccination, during pregnancy and breastfeeding,  during today's visit. The patient's questions and concerns were addressed today, including safety of the vaccination and potential side effects as they have been published by ACOG and [facility]. The patient has been informed that there have not been any documented vaccine related injuries, deaths or birth defects to infant or mom after receiving the COVID-19 vaccine to date. The patient has been made aware that although she is not at increased risk of contracting COVID-19 during pregnancy, she is at increased risk of developing severe disease and complications if she contracts COVID-19 while pregnant. All patient questions were addressed during our visit today. The patient is planning to get vaccinated.  Given website to find an appointment and schedule Covid vaccine.   Routine preventative health maintenance measures emphasized. Please refer to After Visit Summary for other counseling recommendations.    Nolene Bernheim, RN, MSN, NP-BC Nurse Practitioner, [facility]  for [facility]

## 2020-04-24 NOTE — Patient Instructions (Signed)
Covid-19 vaccine - visit [email or web address] for information and locations to receive vaccine  What You Should Know About COVID-19 to Protect Yourself and Others Know about COVID-19  Coronavirus (COVID-19) is an illness caused by a virus that can spread from person to person.  The virus that causes COVID-19 is a new coronavirus that has spread throughout the world.  COVID-19 symptoms can range from mild (or no symptoms) to severe illness. Know how COVID-19 is spread  You can become infected by coming into close contact (about 6 feet or two arm lengths) with a person who has COVID-19. COVID-19 is primarily spread from person to person.  You can become infected from respiratory droplets when an infected person coughs, sneezes, or talks.  You may also be able to get it by touching a surface or object that has the virus on it, and then by touching your mouth, nose, or eyes. Protect yourself and others from COVID-19  There is currently no vaccine to protect against COVID-19. The best way to protect yourself is to avoid being exposed to the virus that causes COVID-19.  Stay home as much as possible and avoid close contact with others.  Wear a mask that covers your nose and mouth in public settings.  Clean and disinfect frequently touched surfaces.  Wash your hands often with soap and water for at least 20 seconds, or use an alcohol-based hand sanitizer that contains at least 60% alcohol. Practice social distancing  Buy groceries and medicine, go to the doctor, and complete banking activities online when possible.  If you must go in person, stay at least 6 feet away from others and disinfect items you must touch.  Get deliveries and takeout, and limit in-person contact as much as possible. Prevent the spread of COVID-19 if you are sick  Stay home if you are sick, except to get medical care.  Avoid public transportation, ride-sharing, or taxis.  Separate yourself from other  people and pets in your home.  There is no specific treatment for COVID-19, but you can seek medical care to help relieve your symptoms.  If you need medical attention, call ahead. Know your risk for severe illness  Everyone is at risk of getting COVID-19.  Older adults and people of any age who have serious underlying medical conditions may be at higher risk for more severe illness. [email or web address] 12/27/2018 This information is not intended to replace advice given to you by your health care provider. Make sure you discuss any questions you have with your health care provider. Document Revised: 06/30/2019 Document Reviewed: 06/30/2019 Elsevier Patient Education  [place] Following a healthy eating pattern may help you to achieve and maintain a healthy body weight, reduce the risk of chronic disease, and live a long and productive life. It is important to follow a healthy eating pattern at an appropriate calorie level for your body. Your nutritional needs should be met primarily through food by choosing a variety of nutrient-rich foods. What are tips for following this plan? Reading food labels  Read labels and choose the following: ? Reduced or low sodium. ? Juices with 100% fruit juice. ? Foods with low saturated fats and high polyunsaturated and monounsaturated fats. ? Foods with whole grains, such as whole wheat, cracked wheat, brown rice, and wild rice. ? Whole grains that are fortified with folic acid. This is recommended for women who are pregnant or who want to become pregnant.  Read  labels and avoid the following: ? Foods with a lot of added sugars. These include foods that contain brown sugar, corn sweetener, corn syrup, dextrose, fructose, glucose, high-fructose corn syrup, honey, invert sugar, lactose, malt syrup, maltose, molasses, raw sugar, sucrose, trehalose, or turbinado sugar.  Do not eat more than the following amounts of  added sugar per day:  6 teaspoons (25 g) for women.  9 teaspoons (38 g) for men. ? Foods that contain processed or refined starches and grains. ? Refined grain products, such as white flour, degermed cornmeal, white bread, and white rice. Shopping  Choose nutrient-rich snacks, such as vegetables, whole fruits, and nuts. Avoid high-calorie and high-sugar snacks, such as potato chips, fruit snacks, and candy.  Use oil-based dressings and spreads on foods instead of solid fats such as butter, stick margarine, or cream cheese.  Limit pre-made sauces, mixes, and "instant" products such as flavored rice, instant noodles, and ready-made pasta.  Try more plant-protein sources, such as tofu, tempeh, black beans, edamame, lentils, nuts, and seeds.  Explore eating plans such as the Mediterranean diet or vegetarian diet. Cooking  Use oil to saut or stir-fry foods instead of solid fats such as butter, stick margarine, or lard.  Try baking, boiling, grilling, or broiling instead of frying.  Remove the fatty part of meats before cooking.  Steam vegetables in water or broth. Meal planning   At meals, imagine dividing your plate into fourths: ? One-half of your plate is fruits and vegetables. ? One-fourth of your plate is whole grains. ? One-fourth of your plate is protein, especially lean meats, poultry, eggs, tofu, beans, or nuts.  Include low-fat dairy as part of your daily diet. Lifestyle  Choose healthy options in all settings, including home, work, school, restaurants, or stores.  Prepare your food safely: ? Wash your hands after handling raw meats. ? Keep food preparation surfaces clean by regularly washing with hot, soapy water. ? Keep raw meats separate from ready-to-eat foods, such as fruits and vegetables. ? Cook seafood, meat, poultry, and eggs to the recommended internal temperature. ? Store foods at safe temperatures. In general:  Keep cold foods at 7F (4.4C) or  below.  Keep hot foods at 17F (60C) or above.  Keep your freezer at [facility] (-17.8C) or below.  Foods are no longer safe to eat when they have been between the temperatures of 40-17F (4.4-60C) for more than 2 hours. What foods should I eat? Fruits Aim to eat 2 cup-equivalents of fresh, canned (in natural juice), or frozen fruits each day. Examples of 1 cup-equivalent of fruit include 1 small apple, 8 large strawberries, 1 cup canned fruit,  cup dried fruit, or 1 cup 100% juice. Vegetables Aim to eat 2-3 cup-equivalents of fresh and frozen vegetables each day, including different varieties and colors. Examples of 1 cup-equivalent of vegetables include 2 medium carrots, 2 cups raw, leafy greens, 1 cup chopped vegetable (raw or cooked), or 1 medium baked potato. Grains Aim to eat 6 ounce-equivalents of whole grains each day. Examples of 1 ounce-equivalent of grains include 1 slice of bread, 1 cup ready-to-eat cereal, 3 cups popcorn, or  cup cooked rice, pasta, or cereal. Meats and other proteins Aim to eat 5-6 ounce-equivalents of protein each day. Examples of 1 ounce-equivalent of protein include 1 egg, 1/2 cup nuts or seeds, or 1 tablespoon (16 g) peanut butter. A cut of meat or fish that is the size of a deck of cards is about 3-4 ounce-equivalents.  Of  the protein you eat each week, try to have at least 8 ounces come from seafood. This includes salmon, trout, herring, and anchovies. Dairy Aim to eat 3 cup-equivalents of fat-free or low-fat dairy each day. Examples of 1 cup-equivalent of dairy include 1 cup (240 mL) milk, 8 ounces (250 g) yogurt, 1 ounces (44 g) natural cheese, or 1 cup (240 mL) fortified soy milk. Fats and oils  Aim for about 5 teaspoons (21 g) per day. Choose monounsaturated fats, such as canola and olive oils, avocados, peanut butter, and most nuts, or polyunsaturated fats, such as sunflower, corn, and soybean oils, walnuts, pine nuts, sesame seeds, sunflower seeds,  and flaxseed. Beverages  Aim for six 8-oz glasses of water per day. Limit coffee to three to five 8-oz cups per day.  Limit caffeinated beverages that have added calories, such as soda and energy drinks.  Limit alcohol intake to no more than 1 drink a day for nonpregnant women and 2 drinks a day for men. One drink equals 12 oz of beer (355 mL), 5 oz of wine (148 mL), or 1 oz of hard liquor (44 mL). Seasoning and other foods  Avoid adding excess amounts of salt to your foods. Try flavoring foods with herbs and spices instead of salt.  Avoid adding sugar to foods.  Try using oil-based dressings, sauces, and spreads instead of solid fats. This information is based on general [place] nutrition guidelines. For more information, visit [employer]. Exact amounts may vary based on your nutrition needs. Summary  A healthy eating plan may help you to maintain a healthy weight, reduce the risk of chronic diseases, and stay active throughout your life.  Plan your meals. Make sure you eat the right portions of a variety of nutrient-rich foods.  Try baking, boiling, grilling, or broiling instead of frying.  Choose healthy options in all settings, including home, work, school, restaurants, or stores. This information is not intended to replace advice given to you by your health care provider. Make sure you discuss any questions you have with your health care provider. Document Revised: 10/26/2017 Document Reviewed: 10/26/2017 Elsevier Patient Education  2020 [employer].     Steps to Quit Smoking Smoking tobacco is the leading cause of preventable death. It can affect almost every organ in the body. Smoking puts you and people around you at risk for many serious, long-lasting (chronic) diseases. Quitting smoking can be hard, but it is one of the best things that you can do for your health. It is never too late to quit. How do I get ready to quit? When you decide to quit smoking, make a plan  to help you succeed. Before you quit: Pick a date to quit. Set a date within the next 2 weeks to give you time to prepare. Write down the reasons why you are quitting. Keep this list in places where you will see it often. Tell your family, friends, and co-workers that you are quitting. Their support is important. Talk with your doctor about the choices that may help you quit. Find out if your health insurance will pay for these treatments. Know the people, places, things, and activities that make you want to smoke (triggers). Avoid them. What first steps can I take to quit smoking? Throw away all cigarettes at home, at work, and in your car. Throw away the things that you use when you smoke, such as ashtrays and lighters. Clean your car. Make sure to empty the ashtray. Clean your home,  including curtains and carpets. What can I do to help me quit smoking? Talk with your doctor about taking medicines and seeing a counselor at the same time. You are more likely to succeed when you do both. If you are pregnant or breastfeeding, talk with your doctor about counseling or other ways to quit smoking. Do not take medicine to help you quit smoking unless your doctor tells you to do so. To quit smoking: Quit right away Quit smoking totally, instead of slowly cutting back on how much you smoke over a period of time. Go to counseling. You are more likely to quit if you go to counseling sessions regularly. Take medicine You may take medicines to help you quit. Some medicines need a prescription, and some you can buy over-the-counter. Some medicines may contain a drug called nicotine to replace the nicotine in cigarettes. Medicines may: Help you to stop having the desire to smoke (cravings). Help to stop the problems that come when you stop smoking (withdrawal symptoms). Your doctor may ask you to use: Nicotine patches, gum, or lozenges. Nicotine inhalers or sprays. Non-nicotine medicine that is taken by  mouth. Find resources Find resources and other ways to help you quit smoking and remain smoke-free after you quit. These resources are most helpful when you use them often. They include: Online chats with a Social worker. Phone quitlines. Printed Furniture conservator/restorer. Support groups or group counseling. Text messaging programs. Mobile phone apps. Use apps on your mobile phone or tablet that can help you stick to your quit plan. There are many free apps for mobile phones and tablets as well as websites. Examples include Quit Guide from the [employer] and smokefree.gov  What things can I do to make it easier to quit?  Talk to your family and friends. Ask them to support and encourage you. Call a phone quitline (1-800-QUIT-NOW), reach out to support groups, or work with a Social worker. Ask people who smoke to not smoke around you. Avoid places that make you want to smoke, such as: Bars. Parties. Smoke-break areas at work. Spend time with people who do not smoke. Lower the stress in your life. Stress can make you want to smoke. Try these things to help your stress: Getting regular exercise. Doing deep-breathing exercises. Doing yoga. Meditating. Doing a body scan. To do this, close your eyes, focus on one area of your body at a time from head to toe. Notice which parts of your body are tense. Try to relax the muscles in those areas. How will I feel when I quit smoking? Day 1 to 3 weeks Within the first 24 hours, you may start to have some problems that come from quitting tobacco. These problems are very bad 2-3 days after you quit, but they do not often last for more than 2-3 weeks. You may get these symptoms: Mood swings. Feeling restless, nervous, angry, or annoyed. Trouble concentrating. Dizziness. Strong desire for high-sugar foods and nicotine. Weight gain. Trouble pooping (constipation). Feeling like you may vomit (nausea). Coughing or a sore throat. Changes in how the medicines that you  take for other issues work in your body. Depression. Trouble sleeping (insomnia). Week 3 and afterward After the first 2-3 weeks of quitting, you may start to notice more positive results, such as: Better sense of smell and taste. Less coughing and sore throat. Slower heart rate. Lower blood pressure. Clearer skin. Better breathing. Fewer sick days. Quitting smoking can be hard. Do not give up if you fail the first  time. Some people need to try a few times before they succeed. Do your best to stick to your quit plan, and talk with your doctor if you have any questions or concerns. Summary Smoking tobacco is the leading cause of preventable death. Quitting smoking can be hard, but it is one of the best things that you can do for your health. When you decide to quit smoking, make a plan to help you succeed. Quit smoking right away, not slowly over a period of time. When you start quitting, seek help from your doctor, family, or friends. This information is not intended to replace advice given to you by your health care provider. Make sure you discuss any questions you have with your health care provider. Document Revised: 04/08/2019 Document Reviewed: 10/02/2018 Elsevier Patient Education  [place].

## 2020-07-13 ENCOUNTER — Ambulatory Visit: Payer: Medicaid Other

## 2020-10-10 ENCOUNTER — Encounter (HOSPITAL_COMMUNITY): Payer: Self-pay

## 2020-10-10 ENCOUNTER — Other Ambulatory Visit: Payer: Self-pay

## 2020-10-10 ENCOUNTER — Ambulatory Visit (HOSPITAL_COMMUNITY)
Admission: EM | Admit: 2020-10-10 | Discharge: 2020-10-10 | Disposition: A | Discharge status: Home / Self Care | Payer: Medicaid Other | Attending: Internal Medicine | Admitting: Internal Medicine

## 2020-10-10 DIAGNOSIS — F17210 Nicotine dependence, cigarettes, uncomplicated: Secondary | ICD-10-CM | POA: Insufficient documentation

## 2020-10-10 DIAGNOSIS — R110 Nausea: Secondary | ICD-10-CM | POA: Diagnosis not present

## 2020-10-10 DIAGNOSIS — Z888 Allergy status to other drugs, medicaments and biological substances status: Secondary | ICD-10-CM | POA: Diagnosis not present

## 2020-10-10 DIAGNOSIS — R1084 Generalized abdominal pain: Secondary | ICD-10-CM | POA: Diagnosis not present

## 2020-10-10 DIAGNOSIS — Z20822 Contact with and (suspected) exposure to covid-19: Secondary | ICD-10-CM | POA: Diagnosis not present

## 2020-10-10 DIAGNOSIS — R197 Diarrhea, unspecified: Secondary | ICD-10-CM | POA: Diagnosis not present

## 2020-10-10 LAB — POCT URINALYSIS DIPSTICK, ED / UC
Bilirubin Urine: NEGATIVE
Glucose, UA: NEGATIVE mg/dL
Ketones, ur: NEGATIVE mg/dL
Leukocytes,Ua: NEGATIVE
Nitrite: NEGATIVE
Protein, ur: 30 mg/dL — AB
Specific Gravity, Urine: >=1.03 (ref 1.005–1.030)
Urobilinogen, UA: 1 mg/dL (ref 0.0–1.0)
pH: 6 (ref 5.0–8.0)

## 2020-10-10 LAB — POC URINE PREG, ED: Preg Test, Ur: NEGATIVE

## 2020-10-10 LAB — SARS CORONAVIRUS 2 (TAT 6-24 HRS): SARS Coronavirus 2: NEGATIVE

## 2020-10-10 NOTE — ED Provider Notes (Signed)
[facility]    CSN: [id number] Arrival date & time: 10/10/20  1043      History   Chief Complaint Chief Complaint  Patient presents with  . Abdominal Pain  . Nausea  . Diarrhea    HPI Laurie Sanders is a 25 y.o. female.   Patient presents with 1 day history of nausea, abdominal pain, diarrhea.  No vomiting.  She states her symptoms have improved today but she missed work and needs a note.  She denies fever, chills, rash, cough, shortness of breath, dysuria, back pain, vaginal discharge, pelvic pain, or other symptoms.  No treatments attempted at home.  Patient reports she is able to drink fluids without problem.  Her medical history includes asthma, depression, intentional drug overdose, gonorrhea.  The history is provided by the patient and medical records.    Past Medical History:  Diagnosis Date  . Asthma   . Gonorrhea 12/22/2018  . Preterm premature rupture of membranes in second trimester 04/29/2011    Patient Active Problem List   Diagnosis Date Noted  . Gonorrhea 12/22/2018  . 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 Intentional drug overdose (HCC)   . 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 Asthma with status asthmaticus 06/11/2012    Past Surgical History:  Procedure Laterality Date  . CESAREAN SECTION  05/09/2011   Procedure: CESAREAN SECTION;  Surgeon: Catalina Antigua, MD;  Location: [facility];  Service: Gynecology;  Laterality: N/A;  . CESAREAN SECTION N/A 11/05/2018   Procedure: CESAREAN SECTION;  Surgeon: Chamois Bing, MD;  Location: [facility];  Service: Obstetrics;  Laterality: N/A;    OB History    Gravida  2   Para  2   Term  1   Preterm  1   AB  0   Living  2     SAB  0   IAB  0   Ectopic  0   Multiple  0   Live Births  2        Obstetric Comments  G1- per op note, classical incision         Home Medications    Prior to Admission medications   Medication Sig  Start Date End Date Taking? Authorizing Provider  albuterol (PROVENTIL HFA;VENTOLIN HFA) 108 (90 BASE) MCG/ACT inhaler Inhale 2 puffs into the lungs every 4 (four) hours as needed for wheezing. Patient not taking: Reported on 01/18/2020 06/13/12   Cioffredi, Candelaria Stagers, MD  metroNIDAZOLE (FLAGYL) 500 MG tablet Take 1 tablet (500 mg total) by mouth 2 (two) times daily. Patient not taking: Reported on 01/18/2020 12/22/18   Tereso Newcomer, MD  metroNIDAZOLE (FLAGYL) 500 MG tablet Take 1 tablet (500 mg total) by mouth 2 (two) times daily. Patient not taking: Reported on 01/18/2020 05/12/19   Reva Bores, MD  Prenatal Vit-Fe Phos-FA-Omega (VITAFOL GUMMIES) 3.33-0.333-34.8 MG CHEW Chew 3 each by mouth daily. Patient not taking: Reported on 01/18/2020 09/30/18   Currie Paris, NP    Family History Family History  Problem Relation Age of Onset  . Diabetes Mother   . Asthma Mother   . Asthma Father   . Hypertension Father     Social History Social History   Tobacco Use  . Smoking status: Light Tobacco Smoker    Packs/day: 0.25    Types: Cigarettes  . Smokeless tobacco: Never Used  Vaping Use  . Vaping Use: Never used  Substance Use Topics  . Alcohol  use: No  . Drug use: No     Allergies   Benadryl [diphenhydramine hcl] and Kiwi extract   Review of Systems Review of Systems  Constitutional: Negative for chills and fever.  HENT: Negative for ear pain and sore throat.   Eyes: Negative for pain and visual disturbance.  Respiratory: Negative for cough and shortness of breath.   Cardiovascular: Negative for chest pain and palpitations.  Gastrointestinal: Positive for abdominal pain, diarrhea and nausea. Negative for vomiting.  Genitourinary: Negative for dysuria, flank pain, hematuria, pelvic pain and vaginal discharge.  Musculoskeletal: Negative for arthralgias and back pain.  Skin: Negative for color change and rash.  Neurological: Negative for seizures and syncope.  All  other systems reviewed and are negative.    Physical Exam Triage Vital Signs ED Triage Vitals  Enc Vitals Group     BP 10/10/20 1054 103/61     Pulse Rate 10/10/20 1054 92     Resp 10/10/20 1054 18     Temp 10/10/20 1054 98.1 F (36.7 C)     Temp Source 10/10/20 1054 Oral     SpO2 10/10/20 1054 98 %     Weight --      Height --      Head Circumference --      Peak Flow --      Pain Score 10/10/20 1053 6     Pain Loc --      Pain Edu? --      Excl. in GC? --    No data found.  Updated Vital Signs BP 103/61 (BP Location: Left Arm)   Pulse 92   Temp 98.1 F (36.7 C) (Oral)   Resp 18   LMP  (LMP Unknown)   SpO2 98%   Visual Acuity Right Eye Distance:   Left Eye Distance:   Bilateral Distance:    Right Eye Near:   Left Eye Near:    Bilateral Near:     Physical Exam Vitals and nursing note reviewed.  Constitutional:      General: She is not in acute distress.    Appearance: She is well-developed. She is not ill-appearing.  HENT:     Head: Normocephalic and atraumatic.     Mouth/Throat:     Mouth: Mucous membranes are moist.  Eyes:     Conjunctiva/sclera: Conjunctivae normal.  Cardiovascular:     Rate and Rhythm: Normal rate and regular rhythm.     Heart sounds: Normal heart sounds.  Pulmonary:     Effort: Pulmonary effort is normal. No respiratory distress.     Breath sounds: Normal breath sounds.  Abdominal:     General: Bowel sounds are normal. There is no distension.     Palpations: Abdomen is soft.     Tenderness: There is no abdominal tenderness. There is no right CVA tenderness, left CVA tenderness, guarding or rebound.  Musculoskeletal:     Cervical back: Neck supple.  Skin:    General: Skin is warm and dry.  Neurological:     General: No focal deficit present.     Mental Status: She is alert and oriented to person, place, and time.     Gait: Gait normal.  Psychiatric:        Mood and Affect: Mood normal.        Behavior: Behavior normal.       UC Treatments / Results  Labs (all labs ordered are listed, but only abnormal results are displayed) Labs Reviewed  POCT  URINALYSIS DIPSTICK, ED / UC - Abnormal; Notable for the following components:      Result Value   Hgb urine dipstick TRACE (*)    Protein, ur 30 (*)    All other components within normal limits  SARS CORONAVIRUS 2 (TAT 6-24 HRS)  POC URINE PREG, ED    EKG   Radiology No results found.  Procedures Procedures (including critical care time)  Medications Ordered in UC Medications - No data to display  Initial Impression / Assessment and Plan / UC Course  I have reviewed the triage vital signs and the nursing notes.  Pertinent labs & imaging results that were available during my care of the patient were reviewed by me and considered in my medical decision making (see chart for details).   Generalized abdominal pain, diarrhea.  Patient declines prescription antinausea medication.  Instructed her to keep herself hydrated with clear liquids and follow the diarrhea diet.  ED precautions discussed.  PCR COVID pending per patient request.  Instructed her to self quarantine until the test result is back.  Instructed her to follow-up with her PCP if her symptoms or not improving.  She agrees to plan of care.   Final Clinical Impressions(s) / UC Diagnoses   Final diagnoses:  Generalized abdominal pain  Diarrhea, unspecified type     Discharge Instructions     Keep yourself hydrated with clear liquids, such as water, Gatorade, Pedialyte, Sprite, or ginger ale.  Follow the attached diarrhea diet.    Go to the emergency department if you have acute worsening symptoms.    Follow up with your primary care provider if your symptoms are not improving.         ED Prescriptions    None     PDMP not reviewed this encounter.   Mickie Bail, NP 10/10/20 1148

## 2020-10-10 NOTE — Discharge Instructions (Signed)
Keep yourself hydrated with clear liquids, such as water, Gatorade, Pedialyte, Sprite, or ginger ale.  Follow the attached diarrhea diet.    Go to the emergency department if you have acute worsening symptoms.    Follow up with your primary care provider if your symptoms are not improving.

## 2020-10-10 NOTE — ED Triage Notes (Signed)
Pt presents with generalized abdominal pain, nausea, and diarrhea since yesterday.

## 2020-12-03 ENCOUNTER — Ambulatory Visit: Payer: Medicaid Other | Admitting: Nurse Practitioner

## 2020-12-26 ENCOUNTER — Ambulatory Visit: Payer: Medicaid Other

## 2020-12-27 ENCOUNTER — Ambulatory Visit (INDEPENDENT_AMBULATORY_CARE_PROVIDER_SITE_OTHER): Payer: Medicaid Other

## 2020-12-27 ENCOUNTER — Other Ambulatory Visit (HOSPITAL_COMMUNITY)
Admission: RE | Admit: 2020-12-27 | Discharge: 2020-12-27 | Disposition: A | Discharge status: Home / Self Care | Payer: Medicaid Other | Source: Ambulatory Visit | Attending: Family Medicine | Admitting: Family Medicine

## 2020-12-27 ENCOUNTER — Other Ambulatory Visit: Payer: Self-pay

## 2020-12-27 VITALS — BP 107/62 | HR 83 | Wt 244.5 lb

## 2020-12-27 DIAGNOSIS — N898 Other specified noninflammatory disorders of vagina: Secondary | ICD-10-CM

## 2020-12-27 DIAGNOSIS — Z113 Encounter for screening for infections with a predominantly sexual mode of transmission: Secondary | ICD-10-CM | POA: Diagnosis not present

## 2020-12-27 NOTE — Progress Notes (Signed)
Here today for STD screening following recent unprotected sexual intercourse with new partner. Pt is experiencing clear discharge with a foul odor. Endorses some itching and irritation. Self-swab instructions given and specimen obtained. Recommended pt also have STD lab work drawn since this has not been done since April 2020; pt agreeable. Explained our office will reach out with any abnormal results.   Fleet Contras RN  12/27/20

## 2020-12-28 LAB — CERVICOVAGINAL ANCILLARY ONLY
Bacterial Vaginitis (gardnerella): POSITIVE — AB
Candida Glabrata: NEGATIVE
Candida Vaginitis: NEGATIVE
Chlamydia: NEGATIVE
Comment: NEGATIVE
Comment: NEGATIVE
Comment: NEGATIVE
Comment: NEGATIVE
Comment: NEGATIVE
Comment: NORMAL
Neisseria Gonorrhea: NEGATIVE
Trichomonas: NEGATIVE

## 2020-12-28 LAB — HEPATITIS B SURFACE ANTIGEN: Hepatitis B Surface Ag: NEGATIVE

## 2020-12-28 LAB — HIV ANTIBODY (ROUTINE TESTING W REFLEX): HIV Screen 4th Generation wRfx: NONREACTIVE

## 2020-12-28 LAB — HEPATITIS C ANTIBODY: Hep C Virus Ab: <0.1 s/co ratio (ref 0.0–0.9)

## 2020-12-28 LAB — RPR: RPR Ser Ql: NONREACTIVE

## 2020-12-28 NOTE — Progress Notes (Signed)
Patient was assessed and managed by nursing staff during this encounter. I have reviewed the chart and agree with the documentation and plan. I have also made any necessary editorial changes.  Baylee Campus, MD 12/28/2020 10:45 AM   

## 2020-12-31 ENCOUNTER — Other Ambulatory Visit: Payer: Self-pay

## 2020-12-31 DIAGNOSIS — B9689 Other specified bacterial agents as the cause of diseases classified elsewhere: Secondary | ICD-10-CM

## 2020-12-31 MED ORDER — METRONIDAZOLE 500 MG PO TABS: 500.0000 mg | ORAL_TABLET | Freq: Two times a day (BID) | ORAL | 0 refills | Status: DC

## 2021-02-20 ENCOUNTER — Encounter (HOSPITAL_COMMUNITY): Payer: Self-pay | Admitting: Emergency Medicine

## 2021-02-20 ENCOUNTER — Ambulatory Visit (HOSPITAL_COMMUNITY)
Admission: EM | Admit: 2021-02-20 | Discharge: 2021-02-20 | Disposition: A | Discharge status: Home / Self Care | Payer: Medicaid Other | Attending: Medical Oncology | Admitting: Medical Oncology

## 2021-02-20 ENCOUNTER — Other Ambulatory Visit: Payer: Self-pay

## 2021-02-20 DIAGNOSIS — N912 Amenorrhea, unspecified: Secondary | ICD-10-CM | POA: Diagnosis not present

## 2021-02-20 DIAGNOSIS — N644 Mastodynia: Secondary | ICD-10-CM | POA: Diagnosis not present

## 2021-02-20 LAB — POC URINE PREG, ED: Preg Test, Ur: NEGATIVE

## 2021-02-20 NOTE — ED Provider Notes (Signed)
[facility]    CSN: [id number] Arrival date & time: 02/20/21  1813      History   Chief Complaint Chief Complaint  Patient presents with   Possible Pregnancy    HPI Laurie Sanders is a 25 y.o. female.   HPI  Amenorrhea: Patient presents for a pregnancy test.  She states that she has noticed some breast tenderness and is past her menstrual cycle.  She states that her last menstrual cycle started on 01/15/2021.  She denies any pelvic pain, abdominal pain or vaginal bleeding.Of note she did take one plan be before her last period.   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   Asthma with status asthmaticus 06/11/2012    Past Surgical History:  Procedure Laterality Date   CESAREAN SECTION  05/09/2011   Procedure: CESAREAN SECTION;  Surgeon: Catalina Antigua, MD;  Location: [facility];  Service: Gynecology;  Laterality: N/A;   CESAREAN SECTION N/A 11/05/2018   Procedure: CESAREAN SECTION;  Surgeon: [place] Bing, MD;  Location: [facility];  Service: Obstetrics;  Laterality: N/A;    OB History     Gravida  2   Para  2   Term  1   Preterm  1   AB  0   Living  2      SAB  0   IAB  0   Ectopic  0   Multiple  0   Live Births  2        Obstetric Comments  G1- per op note, classical incision          Home Medications    Prior to Admission medications   Medication Sig Start Date End Date Taking? Authorizing Provider  metroNIDAZOLE (FLAGYL) 500 MG tablet Take 1 tablet (500 mg total) by mouth 2 (two) times daily. Patient not taking: Reported on 02/20/2021 12/31/20   [place] Bing, MD    Family  History Family History  Problem Relation Age of Onset   Diabetes Mother    Asthma Mother    Asthma Father    Hypertension Father     Social History Social History   Tobacco Use   Smoking status: Light Smoker    Packs/day: 0.25    Types: Cigarettes   Smokeless tobacco: Never  Vaping Use   Vaping Use: Never used  Substance Use Topics   Alcohol use: No   Drug use: No     Allergies   Benadryl [diphenhydramine hcl] and Kiwi extract   Review of Systems Review of Systems  As stated above in HPI Physical Exam Triage Vital Signs ED Triage Vitals  Enc Vitals Group     BP 02/20/21 1903 104/68     Pulse Rate 02/20/21 1903 68     Resp 02/20/21 1903 20     Temp 02/20/21 1903 98.4 F (36.9 C)     Temp Source 02/20/21 1903 Oral     SpO2 02/20/21 1903 98 %     Weight --      Height --      Head Circumference --      Peak Flow --  Pain Score 02/20/21 1900 3     Pain Loc --      Pain Edu? --      Excl. in GC? --    No data found.  Updated Vital Signs BP 104/68 (BP Location: Left Arm) Comment (BP Location): large  Pulse 68   Temp 98.4 F (36.9 C) (Oral)   Resp 20   LMP 01/15/2021   SpO2 98%   Physical Exam Vitals and nursing note reviewed.  Constitutional:      General: She is not in acute distress.    Appearance: Normal appearance. She is not ill-appearing, toxic-appearing or diaphoretic.  Cardiovascular:     Rate and Rhythm: Normal rate and regular rhythm.     Heart sounds: Normal heart sounds.  Pulmonary:     Effort: Pulmonary effort is normal.     Breath sounds: Normal breath sounds.  Abdominal:     Palpations: Abdomen is soft.  Neurological:     Mental Status: She is alert.     UC Treatments / Results  Labs (all labs ordered are listed, but only abnormal results are displayed) Labs Reviewed  POC URINE PREG, ED    EKG   Radiology No results found.  Procedures Procedures (including critical care time)  Medications Ordered in  UC Medications - No data to display  Initial Impression / Assessment and Plan / UC Course  I have reviewed the triage vital signs and the nursing notes.  Pertinent labs & imaging results that were available during my care of the patient were reviewed by me and considered in my medical decision making (see chart for details).     New.  Urine pregnancy test is negative.  I have recommended that she reduce any caffeine intake as this can cause some breast tenderness occasionally.  In addition I would recommend that she follow-up with her OB/GYN regarding her late period.  I have also recommended she start a prenatal vitamin if she is trying for pregnancy. We also discussed that Plan B can cause some menstrual irregularity for the next 1-3 months after use.  Final Clinical Impressions(s) / UC Diagnoses   Final diagnoses:  None   Discharge Instructions   None    ED Prescriptions   None    PDMP not reviewed this encounter.   Rushie Chestnut, [place] 02/20/21 1945

## 2021-02-20 NOTE — ED Notes (Signed)
Patient sent to bathroom for specimen

## 2021-02-20 NOTE — ED Triage Notes (Signed)
Patient requesting a pregnancy test.  Reports tender breasts and late menstrual cycle  Reports lmp started 6/21 and ended 6/22-states he periods last 3 days usually.

## 2021-04-21 ENCOUNTER — Other Ambulatory Visit: Payer: Self-pay

## 2021-04-21 ENCOUNTER — Ambulatory Visit (HOSPITAL_COMMUNITY)
Admission: EM | Admit: 2021-04-21 | Discharge: 2021-04-21 | Disposition: A | Discharge status: Home / Self Care | Payer: Medicaid Other

## 2021-04-21 ENCOUNTER — Encounter (HOSPITAL_COMMUNITY): Payer: Self-pay

## 2021-04-21 DIAGNOSIS — T63441A Toxic effect of venom of bees, accidental (unintentional), initial encounter: Secondary | ICD-10-CM | POA: Diagnosis not present

## 2021-04-21 NOTE — ED Provider Notes (Signed)
[facility]    CSN: [id number] Arrival date & time: 04/21/21  1423      History   Chief Complaint Chief Complaint  Patient presents with   Insect Bite    HPI Cortni Sanders is a 25 y.o. female.   HPI Laurie Sanders is a 25 y.o. female presenting to UC with c/o bee sting to her back Right shoulder about 30 minutes PTA.  She reports intermittent sharp stabbing pain 6/10 at site of sting.  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  Denies rash, swelling of her mouth, trouble breathing, itchy throat, or nausea.  No treatments tried PTA.    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   Asthma with status asthmaticus 06/11/2012    Past Surgical History:  Procedure Laterality Date   CESAREAN SECTION  05/09/2011   Procedure: CESAREAN SECTION;  Surgeon: Catalina Antigua, MD;  Location: [facility];  Service: Gynecology;  Laterality: N/A;   CESAREAN SECTION N/A 11/05/2018   Procedure: CESAREAN SECTION;  Surgeon: [place] Bing, MD;  Location: [facility];  Service: Obstetrics;  Laterality: N/A;    OB History     Gravida  2   Para  2   Term  1   Preterm  1   AB  0   Living  2      SAB  0   IAB  0   Ectopic  0   Multiple  0   Live Births  2        Obstetric Comments  G1- per op note, classical incision          Home Medications    Prior to Admission medications   Medication Sig Start Date End Date Taking? Authorizing Provider  metroNIDAZOLE (FLAGYL) 500 MG  tablet Take 1 tablet (500 mg total) by mouth 2 (two) times daily. Patient not taking: No sig reported 12/31/20   [place] Bing, MD    Family History Family History  Problem Relation Age of Onset   Diabetes Mother    Asthma Mother    Asthma Father    Hypertension Father     Social History Social History   Tobacco Use   Smoking status: Light Smoker    Packs/day: 0.25    Types: Cigarettes   Smokeless tobacco: Never  Vaping Use   Vaping Use: Never used  Substance Use Topics   Alcohol use: No   Drug use: No     Allergies   Benadryl [diphenhydramine hcl] and Kiwi extract   Review of Systems Review of Systems  HENT:  Negative for sore throat and voice change.   Respiratory:  Negative for chest tightness, shortness of breath and wheezing.   Gastrointestinal:  Negative for nausea and vomiting.  Skin:  Negative for rash.    Physical Exam Triage Vital Signs ED Triage Vitals  Enc Vitals Group     BP 04/21/21 1509 108/67     Pulse Rate 04/21/21 1509 71  Resp 04/21/21 1509 18     Temp 04/21/21 1509 98.5 F (36.9 C)     Temp Source 04/21/21 1509 Oral     SpO2 04/21/21 1509 98 %     Weight --      Height --      Head Circumference --      Peak Flow --      Pain Score 04/21/21 1522 6     Pain Loc --      Pain Edu? --      Excl. in GC? --    No data found.  Updated Vital Signs BP 108/67   Pulse 71   Temp 98.5 F (36.9 C) (Oral)   Resp 18   SpO2 98%   Visual Acuity Right Eye Distance:   Left Eye Distance:   Bilateral Distance:    Right Eye Near:   Left Eye Near:    Bilateral Near:     Physical Exam Vitals and nursing note reviewed.  Constitutional:      Appearance: Normal appearance. She is well-developed.     Comments: Pt resting comfortably in exam room, NAD. Alert and cooperative during exam.  HENT:     Head: Normocephalic and atraumatic.  Cardiovascular:     Rate and Rhythm: Normal rate and regular rhythm.  Pulmonary:     Effort:  Pulmonary effort is normal. No respiratory distress.     Breath sounds: Normal breath sounds. No stridor. No wheezing or rales.  Musculoskeletal:        General: Normal range of motion.     Cervical back: Normal range of motion.  Skin:    General: Skin is warm and dry.          Comments: Local reaction at site of sting. No other rashes or lesions.   Neurological:     Mental Status: She is alert and oriented to person, place, and time.  Psychiatric:        Behavior: Behavior normal.     UC Treatments / Results  Labs (all labs ordered are listed, but only abnormal results are displayed) Labs Reviewed - No data to display  EKG   Radiology No results found.  Procedures Procedures (including critical care time)  Medications Ordered in UC Medications - No data to display  Initial Impression / Assessment and Plan / UC Course  I have reviewed the triage vital signs and the nursing notes.  Pertinent labs & imaging results that were available during my care of the patient were reviewed by me and considered in my medical decision making (see chart for details).     Local reaction to bee sting without evidence of foreign body/no residual stinger. No other symptoms, no evidence of allergic reaction specifically no evidence of anaphylaxis at this time. Discussed symptoms that warrant emergent care in the ED. AVS given  Final Clinical Impressions(s) / UC Diagnoses   Final diagnoses:  Bee sting reaction, accidental or unintentional, initial encounter     Discharge Instructions       You may take acetaminophen (Tylenol) every 4-6 hours as needed for pain and you can apply a cool compress for 10-20 minutes at a time to help with itching, pain and inflammation.    Call 911 or go to the emergency department if you develop swelling of your mouth, trouble breathing, nausea/vomiting or other new concerning symptoms develop.      ED Prescriptions   None    PDMP not  reviewed this  encounter.   Laurie Sanders, [place] 04/21/21 1713

## 2021-04-21 NOTE — ED Triage Notes (Signed)
Pt repots she was stung by a wasp in the back 30 min ago. Denies difficulty breathing, talking swallowing.

## 2021-04-21 NOTE — Discharge Instructions (Signed)
  You may take acetaminophen (Tylenol) every 4-6 hours as needed for pain and you can apply a cool compress for 10-20 minutes at a time to help with itching, pain and inflammation.    Call 911 or go to the emergency department if you develop swelling of your mouth, trouble breathing, nausea/vomiting or other new concerning symptoms develop.

## 2021-06-27 DIAGNOSIS — Z419 Encounter for procedure for purposes other than remedying health state, unspecified: Secondary | ICD-10-CM | POA: Diagnosis not present

## 2021-07-28 DIAGNOSIS — Z419 Encounter for procedure for purposes other than remedying health state, unspecified: Secondary | ICD-10-CM | POA: Diagnosis not present

## 2021-08-28 DIAGNOSIS — Z419 Encounter for procedure for purposes other than remedying health state, unspecified: Secondary | ICD-10-CM | POA: Diagnosis not present

## 2021-09-19 ENCOUNTER — Encounter: Payer: Self-pay | Admitting: Family Medicine

## 2021-09-19 ENCOUNTER — Ambulatory Visit (INDEPENDENT_AMBULATORY_CARE_PROVIDER_SITE_OTHER): Payer: Medicaid Other | Admitting: Family Medicine

## 2021-09-19 ENCOUNTER — Other Ambulatory Visit (HOSPITAL_COMMUNITY)
Admission: RE | Admit: 2021-09-19 | Discharge: 2021-09-19 | Disposition: A | Discharge status: Home / Self Care | Payer: Medicaid Other | Source: Ambulatory Visit | Attending: Family Medicine | Admitting: Family Medicine

## 2021-09-19 ENCOUNTER — Other Ambulatory Visit: Payer: Self-pay

## 2021-09-19 VITALS — BP 129/69 | HR 97 | Wt 232.6 lb

## 2021-09-19 DIAGNOSIS — Z01419 Encounter for gynecological examination (general) (routine) without abnormal findings: Secondary | ICD-10-CM | POA: Insufficient documentation

## 2021-09-19 DIAGNOSIS — Z124 Encounter for screening for malignant neoplasm of cervix: Secondary | ICD-10-CM | POA: Diagnosis not present

## 2021-09-19 DIAGNOSIS — Z113 Encounter for screening for infections with a predominantly sexual mode of transmission: Secondary | ICD-10-CM | POA: Diagnosis not present

## 2021-09-19 NOTE — Patient Instructions (Signed)
Preventive Care 21-25 Years Old, Female °Preventive care refers to lifestyle choices and visits with your health care provider that can promote health and wellness. Preventive care visits are also called wellness exams. °What can I expect for my preventive care visit? °Counseling °During your preventive care visit, your health care provider may ask about your: °Medical history, including: °Past medical problems. °Family medical history. °Pregnancy history. °Current health, including: °Menstrual cycle. °Method of birth control. °Emotional well-being. °Home life and relationship well-being. °Sexual activity and sexual health. °Lifestyle, including: °Alcohol, nicotine or tobacco, and drug use. °Access to firearms. °Diet, exercise, and sleep habits. °Work and work environment. °Sunscreen use. °Safety issues such as seatbelt and bike helmet use. °Physical exam °Your health care provider may check your: °Height and weight. These may be used to calculate your BMI (body mass index). BMI is a measurement that tells if you are at a healthy weight. °Waist circumference. This measures the distance around your waistline. This measurement also tells if you are at a healthy weight and may help predict your risk of certain diseases, such as type 2 diabetes and high blood pressure. °Heart rate and blood pressure. °Body temperature. °Skin for abnormal spots. °What immunizations do I need? °Vaccines are usually given at various ages, according to a schedule. Your health care provider will recommend vaccines for you based on your age, medical history, and lifestyle or other factors, such as travel or where you work. °What tests do I need? °Screening °Your health care provider may recommend screening tests for certain conditions. This may include: °Pelvic exam and Pap test. °Lipid and cholesterol levels. °Diabetes screening. This is done by checking your blood sugar (glucose) after you have not eaten for a while (fasting). °Hepatitis B  test. °Hepatitis C test. °HIV (human immunodeficiency virus) test. °STI (sexually transmitted infection) testing, if you are at risk. °BRCA-related cancer screening. This may be done if you have a family history of breast, ovarian, tubal, or peritoneal cancers. °Talk with your health care provider about your test results, treatment options, and if necessary, the need for more tests. °Follow these instructions at home: °Eating and drinking ° °Eat a healthy diet that includes fresh fruits and vegetables, whole grains, lean protein, and low-fat dairy products. °Take vitamin and mineral supplements as recommended by your health care provider. °Do not drink alcohol if: °Your health care provider tells you not to drink. °You are pregnant, may be pregnant, or are planning to become pregnant. °If you drink alcohol: °Limit how much you have to 0-1 drink a day. °Know how much alcohol is in your drink. In the [place] one drink equals one 12 oz bottle of beer (355 mL), one 5 oz glass of wine (148 mL), or one 1½ oz glass of hard liquor (44 mL). °Lifestyle °Brush your teeth every morning and night with fluoride toothpaste. Floss one time each day. °Exercise for at least 30 minutes 5 or more days each week. °Do not use any products that contain nicotine or tobacco. These products include cigarettes, chewing tobacco, and vaping devices, such as e-cigarettes. If you need help quitting, ask your health care provider. °Do not use drugs. °If you are sexually active, practice safe sex. Use a condom or other form of protection to prevent STIs. °If you do not wish to become pregnant, use a form of birth control. If you plan to become pregnant, see your health care provider for a prepregnancy visit. °Find healthy ways to manage stress, such as: °Meditation, yoga,   or listening to music. °Journaling. °Talking to a trusted person. °Spending time with friends and family. °Minimize exposure to UV radiation to reduce your risk of skin  cancer. °Safety °Always wear your seat belt while driving or riding in a vehicle. °Do not drive: °If you have been drinking alcohol. Do not ride with someone who has been drinking. °If you have been using any mind-altering substances or drugs. °While texting. °When you are tired or distracted. °Wear a helmet and other protective equipment during sports activities. °If you have firearms in your house, make sure you follow all gun safety procedures. °Seek help if you have been physically or sexually abused. °What's next? °Go to your health care provider once a year for an annual wellness visit. °Ask your health care provider how often you should have your eyes and teeth checked. °Stay up to date on all vaccines. °This information is not intended to replace advice given to you by your health care provider. Make sure you discuss any questions you have with your health care provider. °Document Revised: 01/09/2021 Document Reviewed: 01/09/2021 °Elsevier Patient Education © 2022 [employer] ° °

## 2021-09-19 NOTE — Progress Notes (Signed)
Subjective:     Laurie Sanders is a 26 y.o. female and is here for a comprehensive physical exam. The patient reports no problems. Cycles are regular and she uses condoms as needed for contraception.   The following portions of the patient's history were reviewed and updated as appropriate: allergies, current medications, past family history, past medical history, past social history, past surgical history, and problem list.  Review of Systems Pertinent items noted in HPI and remainder of comprehensive ROS otherwise negative.   Objective:    BP 129/69    Pulse 97    Wt 232 lb 9.6 oz (105.5 kg)    LMP 09/11/2021 (Exact Date)    BMI 39.93 kg/m  General appearance: alert, cooperative, and appears stated age Head: Normocephalic, without obvious abnormality, atraumatic Neck: no adenopathy, supple, symmetrical, trachea midline, and thyroid not enlarged, symmetric, no tenderness/mass/nodules Lungs: clear to auscultation bilaterally Breasts: normal appearance, no masses or tenderness Heart: regular rate and rhythm, S1, S2 normal, no murmur, click, rub or gallop Abdomen: soft, non-tender; bowel sounds normal; no masses,  no organomegaly Pelvic: cervix normal in appearance, external genitalia normal, no adnexal masses or tenderness, no cervical motion tenderness, uterus normal size, shape, and consistency, and vagina normal without discharge Extremities: extremities normal, atraumatic, no cyanosis or edema Pulses: 2+ and symmetric Skin: Skin color, texture, turgor normal. No rashes or lesions Lymph nodes: Cervical, supraclavicular, and axillary nodes normal. Neurologic: Grossly normal    Assessment:    Healthy female exam.      Plan:  Well woman exam - Plan: Cytology - PAP( [place])  Screening for malignant neoplasm of cervix  Encounter for gynecological examination without abnormal finding  Screen for STD (sexually transmitted disease) - Plan: Cervicovaginal ancillary only( [facility]) Return in 1 year (on 09/19/2022).    See After Visit Summary for Counseling Recommendations

## 2021-09-20 LAB — CERVICOVAGINAL ANCILLARY ONLY
Bacterial Vaginitis (gardnerella): POSITIVE — AB
Candida Glabrata: NEGATIVE
Candida Vaginitis: NEGATIVE
Chlamydia: NEGATIVE
Comment: NEGATIVE
Comment: NEGATIVE
Comment: NEGATIVE
Comment: NEGATIVE
Comment: NEGATIVE
Comment: NORMAL
Neisseria Gonorrhea: NEGATIVE
Trichomonas: NEGATIVE

## 2021-09-21 MED ORDER — METRONIDAZOLE 500 MG PO TABS: 500.0000 mg | ORAL_TABLET | Freq: Two times a day (BID) | ORAL | 0 refills | Status: DC

## 2021-09-21 NOTE — Addendum Note (Signed)
Addended by: Reva Bores on: 09/21/2021 09:24 AM   Modules accepted: Orders

## 2021-09-25 DIAGNOSIS — Z419 Encounter for procedure for purposes other than remedying health state, unspecified: Secondary | ICD-10-CM | POA: Diagnosis not present

## 2021-09-30 ENCOUNTER — Encounter: Payer: Self-pay | Admitting: Family Medicine

## 2021-09-30 ENCOUNTER — Telehealth: Payer: Self-pay | Admitting: *Deleted

## 2021-09-30 LAB — CYTOLOGY - PAP
Adequacy: ABSENT
Chlamydia: NEGATIVE
Comment: NEGATIVE
Comment: NEGATIVE
Comment: NEGATIVE
Comment: NORMAL
Diagnosis: UNDETERMINED — AB
HPV 16: NEGATIVE
HPV 18 / 45: NEGATIVE
High risk HPV: POSITIVE — AB
Neisseria Gonorrhea: NEGATIVE

## 2021-09-30 NOTE — Telephone Encounter (Signed)
-----   Message from Reva Bores, MD sent at 09/30/2021 11:14 AM EST ----- ?Recall pap in 1 year ?

## 2021-09-30 NOTE — Telephone Encounter (Signed)
I called Laurie Sanders and reviewed her results and recommendation for pap in one year. She states she had reviewed the results and voices understanding. ?Nancy Fetter ?

## 2021-10-26 DIAGNOSIS — Z419 Encounter for procedure for purposes other than remedying health state, unspecified: Secondary | ICD-10-CM | POA: Diagnosis not present

## 2021-11-25 DIAGNOSIS — Z419 Encounter for procedure for purposes other than remedying health state, unspecified: Secondary | ICD-10-CM | POA: Diagnosis not present

## 2021-12-26 DIAGNOSIS — Z419 Encounter for procedure for purposes other than remedying health state, unspecified: Secondary | ICD-10-CM | POA: Diagnosis not present

## 2021-12-31 ENCOUNTER — Ambulatory Visit (INDEPENDENT_AMBULATORY_CARE_PROVIDER_SITE_OTHER): Payer: Medicaid Other

## 2021-12-31 ENCOUNTER — Other Ambulatory Visit (HOSPITAL_COMMUNITY)
Admission: RE | Admit: 2021-12-31 | Discharge: 2021-12-31 | Disposition: A | Discharge status: Home / Self Care | Payer: Medicaid Other | Source: Ambulatory Visit | Attending: Family Medicine | Admitting: Family Medicine

## 2021-12-31 VITALS — BP 116/63 | HR 59 | Wt 228.9 lb

## 2021-12-31 DIAGNOSIS — Z113 Encounter for screening for infections with a predominantly sexual mode of transmission: Secondary | ICD-10-CM | POA: Diagnosis not present

## 2021-12-31 NOTE — Progress Notes (Signed)
Patient here today requesting STD testing after sexual intercourse with a new partner. I instructed patient on how to collect a self swab. Self swab collected without issue. I informed patient we will notify her with any abnormal results. Patient verbalized understanding.   Alesia Richards, RN 12/31/21

## 2022-01-01 LAB — CERVICOVAGINAL ANCILLARY ONLY
Chlamydia: NEGATIVE
Comment: NEGATIVE
Comment: NEGATIVE
Comment: NORMAL
Neisseria Gonorrhea: NEGATIVE
Trichomonas: NEGATIVE

## 2022-01-01 LAB — HEPATITIS B SURFACE ANTIGEN: Hepatitis B Surface Ag: NEGATIVE

## 2022-01-01 LAB — RPR: RPR Ser Ql: NONREACTIVE

## 2022-01-01 LAB — HIV ANTIBODY (ROUTINE TESTING W REFLEX): HIV Screen 4th Generation wRfx: NONREACTIVE

## 2022-01-01 LAB — HEPATITIS C ANTIBODY: Hep C Virus Ab: NONREACTIVE

## 2022-01-25 DIAGNOSIS — Z419 Encounter for procedure for purposes other than remedying health state, unspecified: Secondary | ICD-10-CM | POA: Diagnosis not present

## 2022-02-24 ENCOUNTER — Encounter (HOSPITAL_COMMUNITY): Payer: Self-pay | Admitting: Emergency Medicine

## 2022-02-24 ENCOUNTER — Ambulatory Visit (HOSPITAL_COMMUNITY)
Admission: EM | Admit: 2022-02-24 | Discharge: 2022-02-24 | Disposition: A | Discharge status: Home / Self Care | Payer: Medicaid Other | Attending: Family Medicine | Admitting: Family Medicine

## 2022-02-24 DIAGNOSIS — U071 COVID-19: Secondary | ICD-10-CM | POA: Insufficient documentation

## 2022-02-24 DIAGNOSIS — M545 Low back pain, unspecified: Secondary | ICD-10-CM | POA: Diagnosis not present

## 2022-02-24 DIAGNOSIS — B349 Viral infection, unspecified: Secondary | ICD-10-CM | POA: Diagnosis not present

## 2022-02-24 MED ORDER — TIZANIDINE HCL 4 MG PO TABS: 4.0000 mg | ORAL_TABLET | Freq: Three times a day (TID) | ORAL | 0 refills | Status: DC | PRN

## 2022-02-24 MED ORDER — ACETAMINOPHEN 325 MG PO TABS
650.0000 mg | ORAL_TABLET | Freq: Once | ORAL | Status: AC
  Administered 2022-02-24: 650 mg via ORAL

## 2022-02-24 MED ORDER — ONDANSETRON 4 MG PO TBDP: 4.0000 mg | ORAL_TABLET | Freq: Three times a day (TID) | ORAL | 0 refills | Status: DC | PRN

## 2022-02-24 MED ORDER — KETOROLAC TROMETHAMINE 30 MG/ML IJ SOLN
30.0000 mg | Freq: Once | INTRAMUSCULAR | Status: AC
  Administered 2022-02-24: 30 mg via INTRAMUSCULAR

## 2022-02-24 MED ORDER — ACETAMINOPHEN 325 MG PO TABS
ORAL_TABLET | ORAL | Status: AC
Start: 2022-02-24 — End: ?
  Filled 2022-02-24: qty 2

## 2022-02-24 MED ORDER — KETOROLAC TROMETHAMINE 30 MG/ML IJ SOLN
INTRAMUSCULAR | Status: AC
  Filled 2022-02-24: qty 1

## 2022-02-24 NOTE — ED Triage Notes (Addendum)
Pt reports lower back pain since Sunday and loss of appetite beginning today. States she has not eaten anything all day. Denies taking any OTC medication for pain.  Pt also noted to have nasal congestion.

## 2022-02-24 NOTE — Discharge Instructions (Addendum)
  You have been swabbed for COVID, and the test will result in the next 24 hours. Our staff will call you if positive. If the test is positive, you should quarantine for 5 days.  You have been given a shot of Toradol 30 mg today.  Ondansetron dissolved in the mouth every 8 hours as needed for nausea or vomiting. Clear liquids and bland things to eat.   Take tizanidine 4 mg--1 every 8 hours as needed for muscle spasms  You can also take Tylenol as needed for headache or pain.  This often is easier on your stomach than ibuprofen.

## 2022-02-24 NOTE — ED Provider Notes (Addendum)
[facility]    CSN: [id number] Arrival date & time: 02/24/22  1549      History   Chief Complaint Chief Complaint  Patient presents with   Back Pain    HPI Laurie Sanders is a 26 y.o. female.    Back Pain  Here for low back pain.  It began suddenly yesterday after she got out of the pool.  It bothers her more when she lies down now.  She has also had some headache and some nausea since this began.  No vomiting or diarrhea.  She has had a decrease in appetite.  She mentions some nasal congestion to nursing staff, but then she tells me it just happens when she gets hot.  No cough and no shortness of breath.  No dysuria  Last menstrual period was about 2 weeks ago.  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   Asthma with status asthmaticus 06/11/2012    Past Surgical History:  Procedure Laterality Date   CESAREAN SECTION  05/09/2011   Procedure: CESAREAN SECTION;  Surgeon: Catalina Antigua, MD;  Location: [facility];  Service: Gynecology;  Laterality: N/A;   CESAREAN SECTION N/A 11/05/2018   Procedure: CESAREAN SECTION;  Surgeon: Lucky Bing, MD;  Location: [facility];  Service: Obstetrics;  Laterality: N/A;    OB History     Gravida  2   Para  2   Term  1   Preterm  1   AB  0   Living  2      SAB  0   IAB  0   Ectopic  0   Multiple  0   Live Births  2        Obstetric Comments  G1- per op note, classical incision          Home Medications    Prior to Admission medications   Medication Sig Start Date End Date Taking? Authorizing Provider   ondansetron (ZOFRAN-ODT) 4 MG disintegrating tablet Take 1 tablet (4 mg total) by mouth every 8 (eight) hours as needed for nausea or vomiting. 02/24/22  Yes Delphina Schum, Janace Aris, MD  tiZANidine (ZANAFLEX) 4 MG tablet Take 1 tablet (4 mg total) by mouth every 8 (eight) hours as needed for muscle spasms. 02/24/22  Yes Jarely Juncaj, Janace Aris, MD    Family History Family History  Problem Relation Age of Onset   Diabetes Mother    Asthma Mother    Asthma Father    Hypertension Father     Social History Social History   Tobacco Use   Smoking status: Light Smoker    Packs/day: 0.25    Types: Cigarettes   Smokeless tobacco: Never  Vaping Use   Vaping Use: Never used  Substance Use Topics   Alcohol use: No   Drug use: No     Allergies   Benadryl [diphenhydramine hcl] and Kiwi extract   Review of Systems Review of Systems  Musculoskeletal:  Positive for back pain.     Physical Exam Triage Vital Signs ED Triage Vitals  Enc Vitals Group  BP 02/24/22 1707 (!) 91/52     Pulse Rate 02/24/22 1707 88     Resp 02/24/22 1707 20     Temp 02/24/22 1707 100.2 F (37.9 C)     Temp Source 02/24/22 1707 Oral     SpO2 02/24/22 1707 95 %     Weight --      Height --      Head Circumference --      Peak Flow --      Pain Score 02/24/22 1706 7     Pain Loc --      Pain Edu? --      Excl. in GC? --    No data found.  Updated Vital Signs BP (!) 91/52 (BP Location: Right Arm)   Pulse 88   Temp 100.2 F (37.9 C) (Oral)   Resp 20   SpO2 95%   Visual Acuity Right Eye Distance:   Left Eye Distance:   Bilateral Distance:    Right Eye Near:   Left Eye Near:    Bilateral Near:     Physical Exam Vitals reviewed.  Constitutional:      General: She is not in acute distress.    Appearance: She is not ill-appearing, toxic-appearing or diaphoretic.  HENT:     Nose: Congestion present.     Mouth/Throat:     Mouth: Mucous membranes are moist.     Pharynx: No oropharyngeal  exudate or posterior oropharyngeal erythema.  Eyes:     Extraocular Movements: Extraocular movements intact.     Conjunctiva/sclera: Conjunctivae normal.     Pupils: Pupils are equal, round, and reactive to light.  Cardiovascular:     Rate and Rhythm: Normal rate and regular rhythm.     Heart sounds: No murmur heard. Pulmonary:     Effort: Pulmonary effort is normal. No respiratory distress.     Breath sounds: No stridor. No wheezing, rhonchi or rales.  Musculoskeletal:     Cervical back: Neck supple.  Lymphadenopathy:     Cervical: No cervical adenopathy.  Skin:    Capillary Refill: Capillary refill takes less than 2 seconds.     Coloration: Skin is not jaundiced or pale.  Neurological:     Mental Status: She is alert and oriented to person, place, and time.  Psychiatric:        Behavior: Behavior normal.      UC Treatments / Results  Labs (all labs ordered are listed, but only abnormal results are displayed) Labs Reviewed  SARS CORONAVIRUS 2 (TAT 6-24 HRS)    EKG   Radiology No results found.  Procedures Procedures (including critical care time)  Medications Ordered in UC Medications  acetaminophen (TYLENOL) tablet 650 mg (has no administration in time range)  ketorolac (TORADOL) 30 MG/ML injection 30 mg (has no administration in time range)    Initial Impression / Assessment and Plan / UC Course  I have reviewed the triage vital signs and the nursing notes.  Pertinent labs & imaging results that were available during my care of the patient were reviewed by me and considered in my medical decision making (see chart for details).     She has a low-grade temp here of 100.2.  I strongly suspect some viral illness--possible gastroenteritis versus possible COVID.  I am going to treat with a dose of Toradol here.  Swab was done for COVID; if she is positive she is a candidate for antiviral treatment with Paxlovid Final Clinical Impressions(s) / UC  Diagnoses    Final diagnoses:  Acute bilateral low back pain without sciatica  Viral illness     Discharge Instructions       You have been swabbed for COVID, and the test will result in the next 24 hours. Our staff will call you if positive. If the test is positive, you should quarantine for 5 days.  You have been given a shot of Toradol 30 mg today.  Ondansetron dissolved in the mouth every 8 hours as needed for nausea or vomiting. Clear liquids and bland things to eat.   Take tizanidine 4 mg--1 every 8 hours as needed for muscle spasms  You can also take Tylenol as needed for headache or pain.  This often is easier on your stomach than ibuprofen.     ED Prescriptions     Medication Sig Dispense Auth. Provider   tiZANidine (ZANAFLEX) 4 MG tablet Take 1 tablet (4 mg total) by mouth every 8 (eight) hours as needed for muscle spasms. 12 tablet Baudelio Karnes, Janace Aris, MD   ondansetron (ZOFRAN-ODT) 4 MG disintegrating tablet Take 1 tablet (4 mg total) by mouth every 8 (eight) hours as needed for nausea or vomiting. 10 tablet Marlinda Mike Janace Aris, MD      PDMP not reviewed this encounter.   Zenia Resides, MD 02/24/22 1734    Zenia Resides, MD 02/24/22 [phone]

## 2022-02-25 ENCOUNTER — Telehealth (HOSPITAL_COMMUNITY): Payer: Self-pay | Admitting: Emergency Medicine

## 2022-02-25 DIAGNOSIS — Z419 Encounter for procedure for purposes other than remedying health state, unspecified: Secondary | ICD-10-CM | POA: Diagnosis not present

## 2022-02-25 LAB — SARS CORONAVIRUS 2 (TAT 6-24 HRS): SARS Coronavirus 2: POSITIVE — AB

## 2022-02-25 MED ORDER — NIRMATRELVIR/RITONAVIR (PAXLOVID)TABLET: 3.0000 | ORAL_TABLET | Freq: Two times a day (BID) | ORAL | 0 refills | Status: AC

## 2022-02-25 MED ORDER — NIRMATRELVIR/RITONAVIR (PAXLOVID)TABLET: 3.0000 | ORAL_TABLET | Freq: Two times a day (BID) | ORAL | 0 refills | Status: DC

## 2022-03-28 DIAGNOSIS — Z419 Encounter for procedure for purposes other than remedying health state, unspecified: Secondary | ICD-10-CM | POA: Diagnosis not present

## 2022-04-27 DIAGNOSIS — Z419 Encounter for procedure for purposes other than remedying health state, unspecified: Secondary | ICD-10-CM | POA: Diagnosis not present

## 2022-04-30 ENCOUNTER — Ambulatory Visit: Payer: Medicaid Other | Admitting: Certified Nurse Midwife

## 2022-04-30 NOTE — Progress Notes (Signed)
Did not come to visit or call to cancel

## 2022-05-14 ENCOUNTER — Ambulatory Visit: Payer: Medicaid Other | Admitting: Certified Nurse Midwife

## 2022-05-28 DIAGNOSIS — Z419 Encounter for procedure for purposes other than remedying health state, unspecified: Secondary | ICD-10-CM | POA: Diagnosis not present

## 2022-06-27 DIAGNOSIS — Z419 Encounter for procedure for purposes other than remedying health state, unspecified: Secondary | ICD-10-CM | POA: Diagnosis not present

## 2022-07-28 DIAGNOSIS — Z419 Encounter for procedure for purposes other than remedying health state, unspecified: Secondary | ICD-10-CM | POA: Diagnosis not present

## 2022-08-26 ENCOUNTER — Ambulatory Visit: Payer: Medicaid Other | Admitting: Obstetrics and Gynecology

## 2022-08-28 DIAGNOSIS — Z419 Encounter for procedure for purposes other than remedying health state, unspecified: Secondary | ICD-10-CM | POA: Diagnosis not present

## 2022-09-26 DIAGNOSIS — Z419 Encounter for procedure for purposes other than remedying health state, unspecified: Secondary | ICD-10-CM | POA: Diagnosis not present

## 2022-10-02 ENCOUNTER — Ambulatory Visit: Payer: Managed Care, Other (non HMO) | Admitting: Obstetrics and Gynecology

## 2022-10-27 DIAGNOSIS — Z419 Encounter for procedure for purposes other than remedying health state, unspecified: Secondary | ICD-10-CM | POA: Diagnosis not present

## 2022-11-26 DIAGNOSIS — Z419 Encounter for procedure for purposes other than remedying health state, unspecified: Secondary | ICD-10-CM | POA: Diagnosis not present

## 2022-12-04 ENCOUNTER — Other Ambulatory Visit (HOSPITAL_COMMUNITY)
Admission: RE | Admit: 2022-12-04 | Discharge: 2022-12-04 | Disposition: A | Discharge status: Home / Self Care | Payer: Medicaid Other | Source: Ambulatory Visit | Attending: Obstetrics and Gynecology | Admitting: Obstetrics and Gynecology

## 2022-12-04 ENCOUNTER — Ambulatory Visit (INDEPENDENT_AMBULATORY_CARE_PROVIDER_SITE_OTHER): Payer: Medicaid Other

## 2022-12-04 VITALS — BP 115/65 | HR 63 | Ht 64.75 in | Wt 214.6 lb

## 2022-12-04 DIAGNOSIS — N898 Other specified noninflammatory disorders of vagina: Secondary | ICD-10-CM | POA: Insufficient documentation

## 2022-12-04 MED ORDER — METRONIDAZOLE 500 MG PO TABS: 500.0000 mg | ORAL_TABLET | Freq: Two times a day (BID) | ORAL | 0 refills | Status: DC

## 2022-12-04 MED ORDER — FLUCONAZOLE 150 MG PO TABS: 150.0000 mg | ORAL_TABLET | Freq: Once | ORAL | 0 refills | Status: AC

## 2022-12-04 NOTE — Progress Notes (Signed)
Laurie Sanders is here with concern of white-yellow discharge, vaginal itching. These symptoms have been present for 2 days. Patient reports she has tried nothing to resolve symptoms.  Pertinent history:   Plan of care: Sending prophylactic treatment Rx for yeast and BV per protocol, will MyChart message patient with instructions.   Self swab instructions given and specimen obtained. Explained patient will be contacted with any abnormal results. Patient is due for annual exam; offered for patient to schedule during checkout.    Meryl Crutch, RN 12/04/2022  8:59 AM

## 2022-12-05 LAB — CERVICOVAGINAL ANCILLARY ONLY
Bacterial Vaginitis (gardnerella): POSITIVE — AB
Candida Glabrata: NEGATIVE
Candida Vaginitis: NEGATIVE
Chlamydia: NEGATIVE
Comment: NEGATIVE
Comment: NEGATIVE
Comment: NEGATIVE
Comment: NEGATIVE
Comment: NEGATIVE
Comment: NORMAL
Neisseria Gonorrhea: NEGATIVE
Trichomonas: NEGATIVE

## 2022-12-27 DIAGNOSIS — Z419 Encounter for procedure for purposes other than remedying health state, unspecified: Secondary | ICD-10-CM | POA: Diagnosis not present

## 2023-01-06 ENCOUNTER — Encounter: Payer: Self-pay | Admitting: Obstetrics and Gynecology

## 2023-01-06 ENCOUNTER — Other Ambulatory Visit: Payer: Self-pay

## 2023-01-06 ENCOUNTER — Ambulatory Visit (INDEPENDENT_AMBULATORY_CARE_PROVIDER_SITE_OTHER): Payer: Medicaid Other | Admitting: Obstetrics and Gynecology

## 2023-01-06 ENCOUNTER — Other Ambulatory Visit (HOSPITAL_COMMUNITY)
Admission: RE | Admit: 2023-01-06 | Discharge: 2023-01-06 | Disposition: A | Discharge status: Home / Self Care | Payer: Medicaid Other | Source: Ambulatory Visit | Attending: Obstetrics and Gynecology | Admitting: Obstetrics and Gynecology

## 2023-01-06 VITALS — BP 107/73 | HR 77 | Wt 219.0 lb

## 2023-01-06 DIAGNOSIS — L905 Scar conditions and fibrosis of skin: Secondary | ICD-10-CM | POA: Diagnosis not present

## 2023-01-06 DIAGNOSIS — Z124 Encounter for screening for malignant neoplasm of cervix: Secondary | ICD-10-CM | POA: Diagnosis not present

## 2023-01-06 DIAGNOSIS — L299 Pruritus, unspecified: Secondary | ICD-10-CM

## 2023-01-06 DIAGNOSIS — Z113 Encounter for screening for infections with a predominantly sexual mode of transmission: Secondary | ICD-10-CM | POA: Insufficient documentation

## 2023-01-06 DIAGNOSIS — R87612 Low grade squamous intraepithelial lesion on cytologic smear of cervix (LGSIL): Secondary | ICD-10-CM | POA: Diagnosis not present

## 2023-01-06 DIAGNOSIS — Z01419 Encounter for gynecological examination (general) (routine) without abnormal findings: Secondary | ICD-10-CM

## 2023-01-06 MED ORDER — HYDROCORTISONE 1 % EX OINT: 1.0000 | TOPICAL_OINTMENT | Freq: Two times a day (BID) | CUTANEOUS | 0 refills | Status: DC

## 2023-01-06 NOTE — Progress Notes (Signed)
ANNUAL EXAM Patient name: Laurie Sanders MRN [id number]  Date of birth: 1996/01/04 Chief Complaint:   Gynecologic Exam  History of Present Illness:   Laurie Sanders is a 27 y.o. [phone] being seen today for a routine annual exam.  Current complaints: repeat pap 2 small bumps in crease of rectum for a few months - very itching, close to the anus. Has not tried anything for it. Denies hx of constipation   concern for boil under armpit - feels that it may have popped. Typically shaves and changes razor with each use. Denies fever and chills.   Menstrual concerns? No   Breast or nipple changes? No  swabs for STI testing today   Patient's last menstrual period was 12/13/2022.   The pregnancy intention screening data noted above was reviewed. Potential methods of contraception were discussed. The patient elected to proceed with No data recorded.   Last pap     Component Value Date/Time   DIAGPAP (A) 09/19/2021 1054    - Atypical squamous cells of undetermined significance (ASC-US)   DIAGPAP Molecular only (A) 05/04/2019 1408   DIAGPAP  04/30/2018 0000    NEGATIVE FOR INTRAEPITHELIAL LESIONS OR MALIGNANCY.   HPVHIGH Positive (A) 09/19/2021 1054   ADEQPAP  09/19/2021 1054    Satisfactory for evaluation; transformation zone component ABSENT.   ADEQPAP Molecular only 05/04/2019 1408   ADEQPAP  04/30/2018 0000    Satisfactory for evaluation  endocervical/transformation zone component ABSENT.     H/O abnormal pap: yes Last mammogram: n/a Last colonoscopy: n/a.      12/27/2020    4:32 PM 04/24/2020    4:39 PM 01/18/2020    9:24 AM 06/30/2018   10:47 AM 05/27/2018    9:38 AM  Depression screen PHQ 2/9  Decreased Interest 0 0 0 0 2  Down, Depressed, Hopeless 0 0 0 1 1  PHQ - 2 Score 0 0 0 1 3  Altered sleeping 0 1 0 0 2  Tired, decreased energy 0 2 0 2 2  Change in appetite 1 0 0 0 1  Feeling bad or failure about yourself  2 0 0 0 0  Trouble concentrating 1 0 0 0 0   Moving slowly or fidgety/restless 0 0 0 0 0  Suicidal thoughts 0 0 0 0 0  PHQ-9 Score 4 3 0 3 8        12/27/2020    4:32 PM 04/24/2020    4:39 PM 01/18/2020    9:25 AM 06/30/2018   10:47 AM  GAD 7 : Generalized Anxiety Score  Nervous, Anxious, on Edge 0 0 0 1  Control/stop worrying 1 0 0 1  Worry too much - different things 2 1 0 1  Trouble relaxing 0 0 0 0  Restless 0 0 0 0  Easily annoyed or irritable 1 0 0 1  Afraid - awful might happen 1 0 0 1  Total GAD 7 Score 5 1 0 5     Review of Systems:   Pertinent items are noted in HPI Denies any headaches, blurred vision, fatigue, shortness of breath, chest pain, abdominal pain, abnormal vaginal discharge/itching/odor/irritation, problems with periods, bowel movements, urination, or intercourse unless otherwise stated above. Pertinent History Reviewed:  Reviewed past medical,surgical, social and family history.  Reviewed problem list, medications and allergies. Physical Assessment:   Vitals:   01/06/23 0852  BP: 107/73  Pulse: 77  Weight: 219 lb (99.3 kg)  Body mass index is 36.73 kg/m.  Physical Examination:   General appearance - well appearing, and in no distress  Mental status - alert, oriented to person, place, and time  Psych:  She has a normal mood and affect  Skin - warm and dry, normal color, no suspicious lesions noted  Chest - effort normal, all lung fields clear to auscultation bilaterally  Heart - normal rate and regular rhythm  Breasts - breasts appear normal, no suspicious masses, no skin or nipple changes or axillary nodes  Small scarred tissue in right axilla without erythema, fluctuance, drainage, or tenderness, slightly hyperpigmented with small tunnel  Abdomen - soft, nontender, nondistended, no masses or organomegaly  Pelvic -  VULVA: normal appearing vulva with no masses, tenderness or lesions   VAGINA: normal appearing vagina with normal color, white discharge present, no lesions   CERVIX:  normal appearing cervix without discharge or lesions, no CMT 2 small flesh colored bumps on inner buttocks without surround erythema  Thin prep pap is done with HR HPV cotesting  Extremities:  No swelling or varicosities noted  Chaperone present for exam  No results found for this or any previous visit (from the past 24 hour(s)).    Assessment & Plan:   1. Well woman exam with routine gynecological exam - Cervical cancer screening: Discussed screening Q3 years. Reviewed importance of annual exams and limits of pap smear. Pap with reflex HPV collected - GC/CT: Discussed and recommended. Pt  accepts - Breast Health: Encouraged self breast awareness/exams.  - Follow-up: 12 months and prn  - Cytology - PAP  2. Screening for cervical cancer Repeat pap collected today  - Cytology - PAP  3. Screening examination for STI Vaginal swab collected - Cervicovaginal ancillary only  4. Pruritus Recommend trial of topical hydrocortisone and if no improvement follow up with PCP  5. Scar Small scar in right axilla, possible HS scar. No other areas of concern and no prior occurrences. Discussed continued frequent razor changes if shaving continued. If repeat boil were to appear, recommend follow up with dermatology.     Meds:  Meds ordered this encounter  Medications   hydrocortisone 1 % ointment    Sig: Apply 1 Application topically 2 (two) times daily.    Dispense:  30 g    Refill:  0    Follow-up: Return if symptoms worsen or fail to improve, for Annual GYN.  [name], MD 01/06/2023 8:55 AM

## 2023-01-07 LAB — CYTOLOGY - PAP

## 2023-01-07 LAB — CERVICOVAGINAL ANCILLARY ONLY
Bacterial Vaginitis (gardnerella): NEGATIVE
Candida Glabrata: NEGATIVE
Candida Vaginitis: NEGATIVE
Chlamydia: NEGATIVE
Comment: NEGATIVE
Comment: NEGATIVE
Comment: NEGATIVE
Comment: NEGATIVE
Comment: NEGATIVE
Comment: NORMAL
Neisseria Gonorrhea: NEGATIVE
Trichomonas: NEGATIVE

## 2023-01-08 ENCOUNTER — Encounter: Payer: Self-pay | Admitting: Obstetrics and Gynecology

## 2023-01-26 ENCOUNTER — Ambulatory Visit: Payer: Medicaid Other | Admitting: Obstetrics and Gynecology

## 2023-01-26 ENCOUNTER — Other Ambulatory Visit: Payer: Self-pay

## 2023-01-26 DIAGNOSIS — Z419 Encounter for procedure for purposes other than remedying health state, unspecified: Secondary | ICD-10-CM | POA: Diagnosis not present

## 2023-02-26 DIAGNOSIS — Z419 Encounter for procedure for purposes other than remedying health state, unspecified: Secondary | ICD-10-CM | POA: Diagnosis not present

## 2023-03-29 DIAGNOSIS — Z419 Encounter for procedure for purposes other than remedying health state, unspecified: Secondary | ICD-10-CM | POA: Diagnosis not present

## 2023-04-28 DIAGNOSIS — Z419 Encounter for procedure for purposes other than remedying health state, unspecified: Secondary | ICD-10-CM | POA: Diagnosis not present

## 2023-05-29 DIAGNOSIS — Z419 Encounter for procedure for purposes other than remedying health state, unspecified: Secondary | ICD-10-CM | POA: Diagnosis not present

## 2023-06-28 DIAGNOSIS — Z419 Encounter for procedure for purposes other than remedying health state, unspecified: Secondary | ICD-10-CM | POA: Diagnosis not present

## 2023-07-09 ENCOUNTER — Other Ambulatory Visit (HOSPITAL_COMMUNITY)
Admission: RE | Admit: 2023-07-09 | Discharge: 2023-07-09 | Disposition: A | Discharge status: Home / Self Care | Payer: 59 | Source: Ambulatory Visit | Attending: Obstetrics and Gynecology | Admitting: Obstetrics and Gynecology

## 2023-07-09 ENCOUNTER — Encounter: Payer: Self-pay | Admitting: Obstetrics and Gynecology

## 2023-07-09 ENCOUNTER — Other Ambulatory Visit: Payer: Self-pay

## 2023-07-09 ENCOUNTER — Ambulatory Visit (INDEPENDENT_AMBULATORY_CARE_PROVIDER_SITE_OTHER): Payer: 59 | Admitting: Obstetrics and Gynecology

## 2023-07-09 VITALS — BP 109/66 | HR 85 | Wt 228.4 lb

## 2023-07-09 DIAGNOSIS — Z113 Encounter for screening for infections with a predominantly sexual mode of transmission: Secondary | ICD-10-CM

## 2023-07-09 DIAGNOSIS — L290 Pruritus ani: Secondary | ICD-10-CM | POA: Diagnosis not present

## 2023-07-09 DIAGNOSIS — K644 Residual hemorrhoidal skin tags: Secondary | ICD-10-CM

## 2023-07-09 NOTE — Progress Notes (Signed)
GYNECOLOGY VISIT  Patient name: Laurie Sanders MRN [id number]  Date of birth: February 15, 1996 Chief Complaint:   Gynecologic Exam   History:  Laurie Sanders is a 27 y.o. G2P1102 being seen today for anal itching. Did not know a cream had been prescribed therefore has not tried it. Notes that there is a small bump and fears it is dangerous or will prevent engagement in sexual intercourse. Currently shaves vulvar and perienal hair using razor and chagnes it with each use.     Past Medical History:  Diagnosis Date   Asthma    Gonorrhea 12/22/2018   Preterm premature rupture of membranes in second trimester 04/29/2011    Past Surgical History:  Procedure Laterality Date   CESAREAN SECTION  05/09/2011   Procedure: CESAREAN SECTION;  Surgeon: Catalina Antigua, MD;  Location: [facility];  Service: Gynecology;  Laterality: N/A;   CESAREAN SECTION N/A 11/05/2018   Procedure: CESAREAN SECTION;  Surgeon: Lauderdale Bing, MD;  Location: [facility];  Service: Obstetrics;  Laterality: N/A;    The following portions of the patient's history were reviewed and updated as appropriate: allergies, current medications, past family history, past medical history, past social history, past surgical history and problem list.   Health Maintenance:   Last pap     Component Value Date/Time   DIAGPAP - Low grade squamous intraepithelial lesion (LSIL) (A) 01/06/2023 0906   DIAGPAP (A) 09/19/2021 1054    - Atypical squamous cells of undetermined significance (ASC-US)   DIAGPAP Molecular only (A) 05/04/2019 1408   HPVHIGH Positive (A) 09/19/2021 1054   ADEQPAP  01/06/2023 0906    Satisfactory for evaluation; transformation zone component PRESENT.   ADEQPAP  09/19/2021 1054    Satisfactory for evaluation; transformation zone component ABSENT.   ADEQPAP Molecular only 05/04/2019 1408    High Risk HPV: Positive  Adequacy:  Satisfactory for evaluation, transformation zone component  PRESENT  Diagnosis:  Atypical squamous cells of undetermined significance (ASC-US)  Last mammogram: n/a   Review of Systems:  Pertinent items are noted in HPI. Comprehensive review of systems was otherwise negative.   Objective:  Physical Exam BP 109/66   Pulse 85   Wt 228 lb 6.4 oz (103.6 kg)   LMP 06/20/2023 (Exact Date)   BMI 38.30 kg/m    Physical Exam Vitals and nursing note reviewed. Exam conducted with a chaperone present.  Constitutional:      Appearance: Normal appearance.  HENT:     Head: Normocephalic and atraumatic.  Pulmonary:     Effort: Pulmonary effort is normal.     Breath sounds: Normal breath sounds.  Genitourinary:    General: Normal vulva.     Exam position: Lithotomy position.     Vagina: Normal.     Cervix: Normal.     Comments: Small perianal skin tag noted ~4 cm from anal verge, nontender No other lesions noted Normal appearing vulva  Skin:    General: Skin is warm and dry.  Neurological:     General: No focal deficit present.     Mental Status: She is alert.  Psychiatric:        Mood and Affect: Mood normal.        Behavior: Behavior normal.        Thought Content: Thought content normal.        Judgment: Judgment normal.        Assessment & Plan:   1. Screening examination for STI (Primary) Would like STI screening today.  -  Cervicovaginal ancillary only - RPR+HBsAg+HCVAb+...  2. Anal pruritus Recommend trial of topical hydrocortisone and if no improvement, follow up with PCP. Recommend stopping hair removal until itching has resolved.   3. Skin tag of anus Appears to have benign skin tag. No alarming features noted    Routine preventative health maintenance measures emphasized.  [name], MD [facility] for [facility], [facility]

## 2023-07-10 LAB — RPR+HBSAG+HCVAB+...
HIV Screen 4th Generation wRfx: NONREACTIVE
Hep C Virus Ab: NONREACTIVE
Hepatitis B Surface Ag: NEGATIVE
RPR Ser Ql: NONREACTIVE

## 2023-07-13 LAB — CERVICOVAGINAL ANCILLARY ONLY
Bacterial Vaginitis (gardnerella): NEGATIVE
Candida Glabrata: NEGATIVE
Candida Vaginitis: NEGATIVE
Chlamydia: NEGATIVE
Comment: NEGATIVE
Comment: NEGATIVE
Comment: NEGATIVE
Comment: NEGATIVE
Comment: NEGATIVE
Comment: NORMAL
Neisseria Gonorrhea: NEGATIVE
Trichomonas: NEGATIVE

## 2023-07-29 DIAGNOSIS — Z419 Encounter for procedure for purposes other than remedying health state, unspecified: Secondary | ICD-10-CM | POA: Diagnosis not present

## 2023-08-13 ENCOUNTER — Ambulatory Visit: Payer: 59 | Admitting: Obstetrics and Gynecology

## 2023-08-29 DIAGNOSIS — Z419 Encounter for procedure for purposes other than remedying health state, unspecified: Secondary | ICD-10-CM | POA: Diagnosis not present

## 2023-09-26 DIAGNOSIS — Z419 Encounter for procedure for purposes other than remedying health state, unspecified: Secondary | ICD-10-CM | POA: Diagnosis not present

## 2023-11-07 DIAGNOSIS — Z419 Encounter for procedure for purposes other than remedying health state, unspecified: Secondary | ICD-10-CM | POA: Diagnosis not present

## 2023-12-07 DIAGNOSIS — Z419 Encounter for procedure for purposes other than remedying health state, unspecified: Secondary | ICD-10-CM | POA: Diagnosis not present

## 2024-01-07 DIAGNOSIS — Z419 Encounter for procedure for purposes other than remedying health state, unspecified: Secondary | ICD-10-CM | POA: Diagnosis not present

## 2024-02-06 DIAGNOSIS — Z419 Encounter for procedure for purposes other than remedying health state, unspecified: Secondary | ICD-10-CM | POA: Diagnosis not present

## 2024-02-22 ENCOUNTER — Telehealth: Payer: Self-pay | Admitting: Family Medicine

## 2024-02-22 NOTE — Telephone Encounter (Signed)
 Patient has an appt tomorrow (02/23/24) for self swab but is a little concern that she is having neon green discharge coming out. Would like a nurse to call back

## 2024-02-23 ENCOUNTER — Ambulatory Visit (INDEPENDENT_AMBULATORY_CARE_PROVIDER_SITE_OTHER)

## 2024-02-23 ENCOUNTER — Ambulatory Visit

## 2024-02-23 ENCOUNTER — Other Ambulatory Visit (HOSPITAL_COMMUNITY)
Admission: RE | Admit: 2024-02-23 | Discharge: 2024-02-23 | Disposition: A | Discharge status: Home / Self Care | Source: Ambulatory Visit | Attending: Family Medicine | Admitting: Family Medicine

## 2024-02-23 ENCOUNTER — Other Ambulatory Visit: Payer: Self-pay

## 2024-02-23 VITALS — BP 107/63 | HR 70 | Ht 65.0 in | Wt 225.0 lb

## 2024-02-23 DIAGNOSIS — Z202 Contact with and (suspected) exposure to infections with a predominantly sexual mode of transmission: Secondary | ICD-10-CM | POA: Diagnosis present

## 2024-02-23 NOTE — Progress Notes (Signed)
 Laurie Sanders is here with concern of bacterial vaginosis. These symptoms have been present for thick yellow/green discharge that has a fishy odor to it. Patient reports she has tried nothing to resolve symptoms.  Pertinent history: Unprotected sex with a new partner that has had unprotected sex with other individuals.   Plan of care: Self-Swab performed and informed pt results would be in MyChart and we would reach out with any abnormal results.   Self swab instructions given and specimen obtained. Explained patient will be contacted with any abnormal results. Patient is not due for annual exam.  Cyndee JAYSON Molt, RN 02/23/2024  3:10 PM

## 2024-02-23 NOTE — Telephone Encounter (Signed)
 Called and spoke with the patient.  Copied from CRM [id number]. Topic: Appointments - Scheduling Inquiry for Clinic >> Feb 22, 2024 10:07 AM Farrel B wrote: Patient representative from [employer] [place] called from [phone] attempting to assist the patient with getting an appt for a physical. Patient is will be a new patient, and I advised Ms. Rochelle of [facility] Medicaid I would submit the patients information over for a callback. Please call patient at [phone]

## 2024-02-24 ENCOUNTER — Ambulatory Visit: Payer: Self-pay | Admitting: Advanced Practice Midwife

## 2024-02-24 DIAGNOSIS — A5901 Trichomonal vulvovaginitis: Secondary | ICD-10-CM

## 2024-02-24 LAB — CERVICOVAGINAL ANCILLARY ONLY
Bacterial Vaginitis (gardnerella): POSITIVE — AB
Candida Glabrata: NEGATIVE
Candida Vaginitis: NEGATIVE
Chlamydia: NEGATIVE
Comment: NEGATIVE
Comment: NEGATIVE
Comment: NEGATIVE
Comment: NEGATIVE
Comment: NEGATIVE
Comment: NORMAL
Neisseria Gonorrhea: NEGATIVE
Trichomonas: POSITIVE — AB

## 2024-02-24 MED ORDER — METRONIDAZOLE 500 MG PO TABS: ORAL_TABLET | ORAL | 0 refills | Status: DC

## 2024-02-26 ENCOUNTER — Other Ambulatory Visit: Payer: Self-pay | Admitting: Lactation Services

## 2024-02-26 MED ORDER — TINIDAZOLE 500 MG PO TABS: 2.0000 g | ORAL_TABLET | Freq: Every day | ORAL | 0 refills | Status: DC

## 2024-02-26 NOTE — Progress Notes (Signed)
 Sent in order of Tindamax for continued Trich per standing order. Patient took 4 doses of Flagyl  this week and does not feel like this was enough.

## 2024-03-08 ENCOUNTER — Other Ambulatory Visit: Payer: Self-pay

## 2024-03-08 DIAGNOSIS — Z419 Encounter for procedure for purposes other than remedying health state, unspecified: Secondary | ICD-10-CM | POA: Diagnosis not present

## 2024-03-08 MED ORDER — FLUCONAZOLE 150 MG PO TABS: 150.0000 mg | ORAL_TABLET | Freq: Once | ORAL | 1 refills | Status: AC

## 2024-04-08 DIAGNOSIS — Z419 Encounter for procedure for purposes other than remedying health state, unspecified: Secondary | ICD-10-CM | POA: Diagnosis not present

## 2024-05-01 ENCOUNTER — Encounter (HOSPITAL_COMMUNITY): Payer: Self-pay

## 2024-05-01 ENCOUNTER — Ambulatory Visit (HOSPITAL_COMMUNITY): Admission: EM | Admit: 2024-05-01 | Discharge: 2024-05-01 | Disposition: A | Discharge status: Home / Self Care

## 2024-05-01 DIAGNOSIS — N912 Amenorrhea, unspecified: Secondary | ICD-10-CM | POA: Diagnosis not present

## 2024-05-01 LAB — POCT URINE PREGNANCY: Preg Test, Ur: POSITIVE — AB

## 2024-05-01 NOTE — ED Provider Notes (Signed)
 [facility]    CSN: [id number] Arrival date & time: 05/01/24  1005      History   Chief Complaint No chief complaint on file.   HPI Laurie Sanders is a 28 y.o. female.   Patient presents today for pregnancy test.  Patient states that her last menstrual period was 03/24/2024.  Patient has been pregnant twice previously.  Patient states that she took a pregnancy test at home and was positive but does not trust it.  Patient states that she is experiencing fatigue and aversion to the taste of tobacco.  Patient states that she has an appointment with OB/GYN on 10/9.     Past Medical History:  Diagnosis Date  . Asthma   . Gonorrhea 12/22/2018  . Preterm premature rupture of membranes in second trimester 04/29/2011    Patient Active Problem List   Diagnosis Date Noted  . Gonorrhea 12/22/2018  . 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 Intentional drug overdose (HCC)   . 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 Asthma with status asthmaticus 06/11/2012    Past Surgical History:  Procedure Laterality Date  . CESAREAN SECTION  05/09/2011   Procedure: CESAREAN SECTION;  Surgeon: Winton Felt, MD;  Location: [facility];  Service: Gynecology;  Laterality: N/A;  . CESAREAN SECTION N/A 11/05/2018   Procedure: CESAREAN SECTION;  Surgeon: Izell Harari, MD;  Location: [facility];  Service: Obstetrics;  Laterality: N/A;    OB History     Gravida  2   Para  2   Term  1   Preterm  1   AB  0   Living  2      SAB  0   IAB  0   Ectopic  0   Multiple  0   Live Births  2        Obstetric Comments  G1- per op note, classical incision          Home Medications    Prior to Admission medications   Medication Sig Start Date End Date Taking? Authorizing Provider  fluconazole  (DIFLUCAN ) 150 MG tablet Take 150 mg by mouth once. Patient not taking: Reported on 07/09/2023 12/04/22   [provider]   hydrocortisone  1 % ointment Apply 1 Application topically 2 (two) times daily. Patient not taking: Reported on 07/09/2023 01/06/23   Ajewole, Christana, MD  metroNIDAZOLE  (FLAGYL ) 500 MG tablet Take two tablets by mouth twice a day, for one day.  Or you can take all four tablets at once if you can tolerate it. 02/24/24   Claudene, Virginia , CNM  ondansetron  (ZOFRAN -ODT) 4 MG disintegrating tablet Take 1 tablet (4 mg total) by mouth every 8 (eight) hours as needed for nausea or vomiting. Patient not taking: Reported on 12/04/2022 02/24/22   Vonna Sharlet POUR, MD  tinidazole  (TINDAMAX ) 500 MG tablet Take 4 tablets (2,000 mg total) by mouth daily with breakfast. 02/26/24   Claudene, Virginia , CNM  tiZANidine  (ZANAFLEX ) 4 MG tablet Take 1 tablet (4 mg total) by mouth every 8 (eight) hours as needed for muscle spasms. Patient not taking: Reported on 12/04/2022 02/24/22   Vonna Sharlet POUR, MD    Family History Family History  Problem Relation Age of Onset  . Diabetes Mother   . Asthma Mother   . Asthma Father   . Hypertension Father     Social History Social History   Tobacco Use  . Smoking status: Light Smoker  Current packs/day: 0.10    Types: Cigarettes  . Smokeless tobacco: Never  Vaping Use  . Vaping status: Never Used  Substance Use Topics  . Alcohol use: No  . Drug use: No     Allergies   Benadryl [diphenhydramine hcl] and Kiwi extract   Review of Systems Review of Systems   Physical Exam Triage Vital Signs ED Triage Vitals  Encounter Vitals Group     BP 05/01/24 1036 95/63     Girls Systolic BP Percentile --      Girls Diastolic BP Percentile --      Boys Systolic BP Percentile --      Boys Diastolic BP Percentile --      Pulse Rate 05/01/24 1036 66     Resp 05/01/24 1036 18     Temp 05/01/24 1036 98.3 F (36.8 C)     Temp Source 05/01/24 1036 Oral     SpO2 05/01/24 1036 97 %     Weight --      Height --      Head Circumference --      Peak Flow --      Pain  Score 05/01/24 1039 0     Pain Loc --      Pain Education --      Exclude from Growth Chart --    No data found.  Updated Vital Signs BP 95/63 (BP Location: Left Arm)   Pulse 66   Temp 98.3 F (36.8 C) (Oral)   Resp 18   LMP 03/24/2024 (Approximate)   SpO2 97%   Visual Acuity Right Eye Distance:   Left Eye Distance:   Bilateral Distance:    Right Eye Near:   Left Eye Near:    Bilateral Near:     Physical Exam Vitals and nursing note reviewed.  Constitutional:      General: She is not in acute distress.    Appearance: Normal appearance. She is not ill-appearing, toxic-appearing or diaphoretic.  Eyes:     General: No scleral icterus. Cardiovascular:     Rate and Rhythm: Normal rate and regular rhythm.     Heart sounds: Normal heart sounds.  Pulmonary:     Effort: Pulmonary effort is normal. No respiratory distress.     Breath sounds: Normal breath sounds. No wheezing or rhonchi.  Abdominal:     General: Abdomen is flat. Bowel sounds are normal.     Palpations: Abdomen is soft.     Tenderness: There is no abdominal tenderness. There is no right CVA tenderness or left CVA tenderness.  Skin:    General: Skin is warm.  Neurological:     Mental Status: She is alert and oriented to person, place, and time.  Psychiatric:        Mood and Affect: Mood normal.        Behavior: Behavior normal.      UC Treatments / Results  Labs (all labs ordered are listed, but only abnormal results are displayed) Labs Reviewed  POCT URINE PREGNANCY - Abnormal; Notable for the following components:      Result Value   Preg Test, Ur Positive (*)    All other components within normal limits    EKG   Radiology No results found.  Procedures Procedures (including critical care time)  Medications Ordered in UC Medications - No data to display  Initial Impression / Assessment and Plan / UC Course  I have reviewed the triage vital signs and the nursing  notes.  Pertinent labs  & imaging results that were available during my care of the patient were reviewed by me and considered in my medical decision making (see chart for details).  Clinical Course as of 05/01/24 1105  Sun May 01, 2024  1105 Preg Test, Ur(!): Positive [SP]    Clinical Course User Index [SP] Andra Corean BROCKS, PA-C   Amenorrhea-POCT pregnancy test positive.  Patient has appointment with OB/GYN on 10/9. Final Clinical Impressions(s) / UC Diagnoses   Final diagnoses:  Amenorrhea     Discharge Instructions      Pregnancy test was positive.  Your appointment with OB/GYN on 10/9.     ED Prescriptions   None    PDMP not reviewed this encounter.   Andra Corean BROCKS, PA-C 05/01/24 1102

## 2024-05-01 NOTE — ED Triage Notes (Signed)
 Patient presents to the office for UPT. Last menstrual was 03/24/2024. Patient would like proof of pregnancy.

## 2024-05-01 NOTE — Discharge Instructions (Addendum)
 Pregnancy test was positive.  Your appointment with OB/GYN on 10/9.

## 2024-05-05 ENCOUNTER — Ambulatory Visit: Admitting: Obstetrics and Gynecology

## 2024-05-19 ENCOUNTER — Encounter: Payer: Self-pay | Admitting: Obstetrics and Gynecology

## 2024-06-07 ENCOUNTER — Telehealth

## 2024-06-07 ENCOUNTER — Other Ambulatory Visit

## 2024-06-07 DIAGNOSIS — Z3689 Encounter for other specified antenatal screening: Secondary | ICD-10-CM

## 2024-06-07 DIAGNOSIS — Z3490 Encounter for supervision of normal pregnancy, unspecified, unspecified trimester: Secondary | ICD-10-CM | POA: Insufficient documentation

## 2024-06-07 DIAGNOSIS — Z98891 History of uterine scar from previous surgery: Secondary | ICD-10-CM | POA: Insufficient documentation

## 2024-06-07 DIAGNOSIS — O219 Vomiting of pregnancy, unspecified: Secondary | ICD-10-CM

## 2024-06-07 DIAGNOSIS — O3680X0 Pregnancy with inconclusive fetal viability, not applicable or unspecified: Secondary | ICD-10-CM

## 2024-06-07 MED ORDER — PROMETHAZINE HCL 25 MG PO TABS: 25.0000 mg | ORAL_TABLET | Freq: Four times a day (QID) | ORAL | 0 refills | Status: AC | PRN

## 2024-06-07 NOTE — Progress Notes (Signed)
 New OB Intake  I connected with Breawna Chalmers  on 06/07/24 at  9:15 AM EST by MyChart Video Visit and verified that I am speaking with the correct person using two identifiers. Nurse is located at [facility] and pt is located at home.  I discussed the limitations, risks, security and privacy concerns of performing an evaluation and management service by telephone and the availability of in person appointments. I also discussed with the patient that there may be a patient responsible charge related to this service. The patient expressed understanding and agreed to proceed.  I explained I am completing New OB Intake today. We discussed EDD of 12/29/24 based on sure LMP of 03/24/24. Dating US  scheduled for today. Pt is G3P1102. I reviewed her allergies, medications and Medical/Surgical/OB history.    Patient Active Problem List   Diagnosis Date Noted   Supervision of low-risk pregnancy 06/07/2024   History of C-section 06/07/2024   Pelvic adhesive disease 11/05/2018   Intentional drug overdose (HCC)    Asthma, mild 06/11/2012    Concerns addressed today Nausea without vomiting- Phenergan sent to pharmacy Planning abortion- See below  Delivery Plans Plans to deliver at [facility]. Patient is not a candidate for waterbirth.  MyChart/Babyscripts MyChart access verified. I explained pt will have some visits in office and some virtually. Babyscripts instructions given and order placed.  Blood Pressure Cuff/Weight Scale Blood pressure cuff ordered for patient to pick-up from [employer]. Explained after first prenatal appt pt will check weekly and document in Babyscripts. Patient does not have weight scale; order sent to [facility], patient may track weight weekly in Babyscripts.  Anatomy US  Explained anatomy US  will be around 19 weeks and will be scheduled after dating US  results are available.  Is patient a CenteringPregnancy candidate?  Declined Declined due to Group setting  Is  patient a Mom+Baby Combined Care candidate?  Not a candidate    Is patient a candidate for Babyscripts Optimization? Yes, patient accepted   First visit review I reviewed new OB appt with patient. Explained pt will be seen by Vina Solian, MD at first visit.    Last Pap Diagnosis  Date Value Ref Range Status  01/06/2023 - Low grade squamous intraepithelial lesion (LSIL) (A)  Final   At the end of visit patient disclosed she is planning abortion and has consultation scheduled at [facility] tomorrow. She would prefer to cancel dating US  in our office later today and change new OB visit to abortion follow-up in about 4 weeks. Orders for BP cuff and weight scale canceled. Encouraged patient to pick up Phenergan for nausea relief.  If patient decides to continue pregnancy after consultation she may reschedule new OB visit and will not need a second intake appointment. Reviewed MAU continues to be available for emergencies.  Vernell FORBES Ruddle, RN 06/07/2024  10:54 AM

## 2024-06-13 ENCOUNTER — Encounter: Payer: Self-pay | Admitting: Obstetrics and Gynecology

## 2024-07-07 ENCOUNTER — Ambulatory Visit: Admitting: Obstetrics and Gynecology

## 2024-08-08 ENCOUNTER — Ambulatory Visit (INDEPENDENT_AMBULATORY_CARE_PROVIDER_SITE_OTHER): Payer: Self-pay

## 2024-08-08 ENCOUNTER — Other Ambulatory Visit: Payer: Self-pay

## 2024-08-08 ENCOUNTER — Other Ambulatory Visit (HOSPITAL_COMMUNITY)
Admission: RE | Admit: 2024-08-08 | Discharge: 2024-08-08 | Disposition: A | Discharge status: Home / Self Care | Source: Ambulatory Visit | Attending: Family Medicine | Admitting: Family Medicine

## 2024-08-08 VITALS — BP 104/64 | HR 74 | Ht 65.0 in | Wt 226.5 lb

## 2024-08-08 DIAGNOSIS — N898 Other specified noninflammatory disorders of vagina: Secondary | ICD-10-CM | POA: Diagnosis present

## 2024-08-08 NOTE — Progress Notes (Signed)
 Laurie Sanders is here today for a self-swab. Patient states she is experiencing yellow discharge, mild odor, and vaginal itching. I explained how to obtain a self-swab; patient verbalizes understanding. I explained if there are any abnormal results we will be in contact. Patient verbalized understanding. Patient states no further questions or concerns.  Devon, RN 08/08/24

## 2024-08-09 LAB — CERVICOVAGINAL ANCILLARY ONLY
Bacterial Vaginitis (gardnerella): POSITIVE — AB
Candida Glabrata: NEGATIVE
Candida Vaginitis: NEGATIVE
Chlamydia: NEGATIVE
Comment: NEGATIVE
Comment: NEGATIVE
Comment: NEGATIVE
Comment: NEGATIVE
Comment: NEGATIVE
Comment: NORMAL
Neisseria Gonorrhea: NEGATIVE
Trichomonas: NEGATIVE

## 2024-08-10 ENCOUNTER — Ambulatory Visit: Payer: Self-pay | Admitting: Obstetrics and Gynecology

## 2024-08-10 DIAGNOSIS — B9689 Other specified bacterial agents as the cause of diseases classified elsewhere: Secondary | ICD-10-CM

## 2024-08-10 DIAGNOSIS — N898 Other specified noninflammatory disorders of vagina: Secondary | ICD-10-CM

## 2024-08-10 MED ORDER — METRONIDAZOLE 500 MG PO TABS: 500.0000 mg | ORAL_TABLET | Freq: Two times a day (BID) | ORAL | 0 refills | Status: AC

## 2024-08-10 MED ORDER — FLUCONAZOLE 150 MG PO TABS: 150.0000 mg | ORAL_TABLET | Freq: Once | ORAL | 0 refills | Status: AC

## 2024-09-20 ENCOUNTER — Ambulatory Visit: Payer: Self-pay | Admitting: Advanced Practice Midwife
